# Patient Record
Sex: Female | Born: 1970 | Race: White | Hispanic: No | Marital: Married | Smoking: Former smoker
Health system: Southern US, Community
[De-identification: ages and names within clinical notes are randomized; demographics above are authoritative.]

## PROBLEM LIST (undated history)

## (undated) DIAGNOSIS — T7840XA Allergy, unspecified, initial encounter: Secondary | ICD-10-CM

## (undated) DIAGNOSIS — F419 Anxiety disorder, unspecified: Secondary | ICD-10-CM

## (undated) HISTORY — PX: TONSILLECTOMY: SUR1361

## (undated) HISTORY — DX: Allergy, unspecified, initial encounter: T78.40XA

## (undated) HISTORY — PX: CHOLECYSTECTOMY: SHX55

## (undated) HISTORY — PX: TYMPANOSTOMY TUBE PLACEMENT: SHX32

## (undated) HISTORY — DX: Anxiety disorder, unspecified: F41.9

## (undated) HISTORY — PX: APPENDECTOMY: SHX54

## (undated) HISTORY — PX: LASIK: SHX215

## (undated) HISTORY — PX: NOSE SURGERY: SHX723

## (undated) HISTORY — PX: OTHER SURGICAL HISTORY: SHX169

## (undated) HISTORY — PX: FOOT SURGERY: SHX648

---

## 1998-09-17 ENCOUNTER — Encounter: Payer: Self-pay | Admitting: Obstetrics and Gynecology

## 1998-09-17 ENCOUNTER — Inpatient Hospital Stay (HOSPITAL_COMMUNITY): Admission: AD | Admit: 1998-09-17 | Discharge: 1998-09-17 | Payer: Self-pay | Admitting: Obstetrics and Gynecology

## 1998-10-20 ENCOUNTER — Inpatient Hospital Stay (HOSPITAL_COMMUNITY): Admission: AD | Admit: 1998-10-20 | Discharge: 1998-10-23 | Payer: Self-pay | Admitting: Obstetrics and Gynecology

## 1998-10-20 ENCOUNTER — Encounter (INDEPENDENT_AMBULATORY_CARE_PROVIDER_SITE_OTHER): Payer: Self-pay | Admitting: *Deleted

## 1998-10-24 ENCOUNTER — Encounter (HOSPITAL_COMMUNITY): Admission: RE | Admit: 1998-10-24 | Discharge: 1999-01-22 | Payer: Self-pay | Admitting: Obstetrics and Gynecology

## 1998-11-28 ENCOUNTER — Other Ambulatory Visit: Admission: RE | Admit: 1998-11-28 | Discharge: 1998-11-28 | Payer: Self-pay | Admitting: Obstetrics and Gynecology

## 2000-01-01 ENCOUNTER — Other Ambulatory Visit: Admission: RE | Admit: 2000-01-01 | Discharge: 2000-01-01 | Payer: Self-pay | Admitting: Obstetrics and Gynecology

## 2001-01-02 ENCOUNTER — Other Ambulatory Visit: Admission: RE | Admit: 2001-01-02 | Discharge: 2001-01-02 | Payer: Self-pay | Admitting: Obstetrics and Gynecology

## 2002-02-16 ENCOUNTER — Other Ambulatory Visit: Admission: RE | Admit: 2002-02-16 | Discharge: 2002-02-16 | Payer: Self-pay | Admitting: Obstetrics and Gynecology

## 2003-03-30 ENCOUNTER — Other Ambulatory Visit: Admission: RE | Admit: 2003-03-30 | Discharge: 2003-03-30 | Payer: Self-pay | Admitting: Obstetrics and Gynecology

## 2005-07-31 ENCOUNTER — Inpatient Hospital Stay (HOSPITAL_COMMUNITY): Admission: RE | Admit: 2005-07-31 | Discharge: 2005-08-02 | Payer: Self-pay | Admitting: Psychiatry

## 2005-08-01 ENCOUNTER — Ambulatory Visit: Payer: Self-pay | Admitting: Psychiatry

## 2005-08-24 ENCOUNTER — Ambulatory Visit (HOSPITAL_COMMUNITY): Payer: Self-pay | Admitting: Psychiatry

## 2005-09-21 ENCOUNTER — Ambulatory Visit (HOSPITAL_COMMUNITY): Payer: Self-pay | Admitting: Psychiatry

## 2005-11-19 ENCOUNTER — Ambulatory Visit (HOSPITAL_COMMUNITY): Payer: Self-pay | Admitting: Psychiatry

## 2006-01-21 ENCOUNTER — Ambulatory Visit (HOSPITAL_COMMUNITY): Payer: Self-pay | Admitting: Psychiatry

## 2006-04-24 ENCOUNTER — Ambulatory Visit (HOSPITAL_COMMUNITY): Payer: Self-pay | Admitting: Psychiatry

## 2006-08-19 ENCOUNTER — Ambulatory Visit (HOSPITAL_COMMUNITY): Payer: Self-pay | Admitting: Psychiatry

## 2006-11-18 ENCOUNTER — Ambulatory Visit (HOSPITAL_COMMUNITY): Payer: Self-pay | Admitting: Psychiatry

## 2007-04-02 ENCOUNTER — Ambulatory Visit (HOSPITAL_COMMUNITY): Payer: Self-pay | Admitting: Psychiatry

## 2007-11-07 ENCOUNTER — Encounter: Admission: RE | Admit: 2007-11-07 | Discharge: 2007-11-07 | Payer: Self-pay | Admitting: Family Medicine

## 2010-01-25 ENCOUNTER — Encounter: Admission: RE | Admit: 2010-01-25 | Discharge: 2010-01-25 | Payer: Self-pay | Admitting: Obstetrics and Gynecology

## 2010-03-29 ENCOUNTER — Ambulatory Visit
Admission: RE | Admit: 2010-03-29 | Discharge: 2010-03-29 | Payer: Self-pay | Source: Home / Self Care | Attending: Obstetrics and Gynecology | Admitting: Obstetrics and Gynecology

## 2010-06-16 NOTE — Op Note (Signed)
NAMEFRANCE, [name]              ACCOUNT NO.:  [id number]  MEDICAL RECORD NO.:  [id number]          PATIENT TYPE:  AMB  LOCATION:  [facility]                         FACILITY:  [facility]  PHYSICIAN:  Sherry A. Dickstein, M.D.DATE OF BIRTH:  Sep 22, 1970  DATE OF PROCEDURE:  03/29/2010 DATE OF DISCHARGE:                              OPERATIVE REPORT   PREOPERATIVE DIAGNOSES:  Menorrhagia and endometrial polyp.  POSTOPERATIVE DIAGNOSES:  Menorrhagia and endometrial polyp.  PROCEDURE:  Dilatation and curettage hysteroscopy with resectoscope and NovaSure endometrial ablation.  SURGEON:  Sherry A. Rosalio Macadamia, M.D.  ANESTHESIA:  MAC.  INDICATIONS:  This is a 40 year old G2, P 2-0-0-2 woman whose menstrual cycles every 28 days lasting 5-7 days with heavy flow with clotting. The patient is using vasectomy for contraception.  The patient had a sonohysterogram evaluation which showed an endometrial polyp present. Although the endometrial polyp has been recommended to be removed with a D and C hysteroscopy with resectoscope, the patient also has requested an endometrial ablation.  Therefore, the patient is brought to the operating room for a D and C hysteroscopy with resection as well as a NovaSure endometrial ablation.  FINDINGS:  Normal-sized anteflexed uterus.  No adnexal mass and endometrial polyp present.  PROCEDURE:  The patient was brought into the operating room and given adequate IV sedation.  She was placed in the dorsal lithotomy position. Her perineum was washed with Betadine.  Pelvic examination was performed.  Surgeon's gown and gloves were changed.  Speculum was placed within the vagina.  Vagina was washed with Betadine.  Paracervical block was administered with 1% Nesacaine.  Anterior lip of the cervix was grasped with a single-toothed tenaculum.  Cervix was sounded and the endometrial cavity length was measured as well as the endocervical length was measured.  The cervix was  dilated.  However, no significant dilatation was necessary and the hysteroscope was introduced into the endometrial cavity.  Pictures were obtained.  Using a right angle resector the polyps were removed from the endometrial cavity of which there appeared to be two.  Once these polyps were removed, the hysteroscope was removed.  Then the NovaSure instrument was introduced into the endometrial cavity in a routine fashion.  It was opened and adjusted so that it was completely opened.  The width was noted to be 2.5 cm.  The length of the cavity was 4.5 cm.  The endocervical length was 3.5 cm.  The endometrial ablation was started after the cavity had been tested.  The length of the ablation was 2 minutes.  The power was 62 watts and the ablation proceeded without any difficulty.  Once the ablation was completed after the 2 minutes the NovaSure instrument was closed without difficulty.  It was removed from the endometrial cavity and the hysteroscope was reintroduced into the endometrial cavity with complete ablation noted and a picture was obtained.  All instruments were then removed from the vagina.  Adequate hemostasis was present. The patient was taken out of the dorsal lithotomy position.  She was awakened.  She was moved from the operating table to a stretcher in stable condition.  COMPLICATIONS:  None.  ESTIMATED BLOOD LOSS:  Less than 5 cc.     Sherry A. Rosalio Macadamia, M.D.     SAD/MEDQ  D:  03/29/2010  T:  03/29/2010  Job:  [id number]  Electronically Signed by Floyde Parkins M.D. on 06/16/2010 12:35:03 PM

## 2010-06-27 LAB — POCT HEMOGLOBIN-HEMACUE: Hemoglobin: 14.6 g/dL (ref 12.0–15.0)

## 2010-06-27 LAB — POCT PREGNANCY, URINE: Preg Test, Ur: NEGATIVE

## 2010-08-17 ENCOUNTER — Other Ambulatory Visit: Payer: Self-pay | Admitting: Obstetrics and Gynecology

## 2010-09-01 NOTE — H&P (Signed)
[name], [name] NO.:  [id number]   MEDICAL RECORD NO.:  [id number]          PATIENT TYPE:  IPS   LOCATION:  0303                          FACILITY:  [facility]   PHYSICIAN:  Anselm Jungling, MD  DATE OF BIRTH:  11-30-1970   DATE OF ADMISSION:  07/31/2005  DATE OF DISCHARGE:                         PSYCHIATRIC ADMISSION ASSESSMENT   IDENTIFYING INFORMATION:  This is a 40 year old white female who is married.  This is a voluntarily admission.   HISTORY OF PRESENT ILLNESS:  This patient was referred by Dr. Arvilla Market, her  primary care physician. After the patient expressed to him that she had so  much anger towards her husband and 52 year old stepdaughter that she feared  she would hurt herself or the stepdaughter. The patient was referred over  from the M.D.'s office. Today, this pleasant 40 year old patient reports  that conflict in parenting of the 41 year old has been a chronic marital  problem between them for several years. They separated once for 10 months in  the past over this. The patient feels that the husband sides with the 22-  year-old daughter against his wife and allows the 24 year old to speak  disrespectfully to her. The patient feels overwhelmed by this, is hurt by it  and felt that she could no longer cope on Sunday when the husband said that  he was going to leave her again over issues with the 41 year old. The  patient endorses depressed mood, feeling overwhelmed, more angry and  irritable to the point that she feels that her anger is somewhat out of  proportion to the situation. Says her mood is depressed and irritable. Has  had vague suicidal thoughts when she is really anger. Feels that she will  loose control. At one point last week, she took a knife into the bathroom  with her, thinking she might want to hurt herself. She has no prior history  of attempts but has access to both guns and knife. She denies that she is  truly homicidal toward  the stepdaughter but fears that she will loose her  temper and hit her or otherwise harm her. She denies any psychotic symptoms.   PAST PSYCHIATRIC HISTORY:  This is the patient's first treatment for  depression. She has no prior history of depression. No prior  hospitalizations or treatment for mood disorder. Has no history of substance  abuse.   SOCIAL HISTORY:  Second marriage for this 40 year old white female who has  children of her own in the home, blended family. No current legal issues.  Husband's level of support is unclear.   FAMILY HISTORY:  Remarkable for father with history of alcoholism.   PAST MEDICAL HISTORY:  The patient's primary care [name] is Dr. Arvilla Market.  Medical problems are normal. Past medical history is remarkable for history  of dry eyes treated with Restasis. Hospitalizations for childbirth only.  Surgery:  Cesarean section in 2006. No history of seizures, blackouts or  memory loss.   MEDICATIONS:  None.   DRUG ALLERGIES:  PENICILLIN which causes rash.   POSITIVE PHYSICAL FINDINGS:  Well-developed, well-nourished female in no  acute  distress. Grooming and hygiene are appropriate.   REVIEW OF SYSTEMS:  Remarkable for sleeping good. Appetite is satisfactory.  No recent weight change. Feeling chronically dysphoric and anxious around  the home. Denies headache or muscle aches. Denies shortness of breath or  symptoms of panic. Denies any abdominal pain. Denies any change in bowel or  bladder function. Review of systems is otherwise negative. No history of  fever or chills.   PHYSICAL EXAMINATION:  Well-developed, well-nourished female in no acute  distress. Five feet tall, 132 pounds. Temperature 98.1, pulse 85,  respirations 20. Blood pressure initially at admission was 141/94 and today  128/85. Pulse 95 today.  HEENT:  Head normocephalic, atraumatic. EENT:  PERRL. Sclerae nonicteric.  Extraocular movements are normal. No rhinorrhea. Oropharynx in  good  condition. Teeth in good condition.  NECK:  Supple. No thyromegaly.  CHEST:  Clear to auscultation.  BREASTS:  Deferred.  CARDIOVASCULAR:  S1 and S2 is heard. No clicks, murmurs or gallops.  ABDOMEN:  Flat, soft, nontender, nondistended.  GENITOURINARY:  Deferred.  EXTREMITIES:  Pink, warm. No cyanosis. Peripheral pulses 2+. No clubbing.  SKIN:  Pale in tone. No rashes. No lesions.  NEUROLOGICAL:  Cranial nerves II-XII are intact. Motor and sensory intact.  Cerebellar function is intact. Neuro is nonfocal.   DIAGNOSTIC STUDIES:  CBC:  WBC 7.8, hemoglobin 14.0, hematocrit 41.8,  platelets 318,000. Chemistry:  141, potassium 3.9, chloride 108, carbon  dioxide 27, BUN 8, creatinine 0.8. Random glucose at 87. Liver enzymes:  SGOT 18, SGPT 14, alkaline phosphatase 31, total bilirubin 0.9. Calcium  within normal limits and magnesium normal. TSH is currently pending, as is  her UA and UDS.   MENTAL STATUS EXAMINATION:  Fully alert female, pleasant, cooperative with  somewhat anxious affect. She does become tearful and a little bit irritable  when starting to talk about the situation at home with her husband. Speech  is normal in pace, tone and amount. No pressure. She is articulate, fluent.  Mood is anxious and depressed, some irritability. Thought process logical  and coherent. Insight is adequate. She is able to clearly say that she is  not homicidal towards her daughter but does fear that her anger becomes so  bad that she would harm her and does not think that she would ever hurt  herself but admits that she has been angry to the point that she gets out of  control. Contracts for safety on the unit. Cognitively, she is intact and  oriented x3. Recent remote memory is intact. Insight good. Impulse control  and judgment within normal limits. Calculation within normal limits.  AXIS I:  Rule out major depression, initial episode, severe.  AXIS II:  No diagnosis.  AXIS III:  No  diagnosis.  AXIS IV:  Severe marital stress and parenting stress.  AXIS V:  Current 35 to 40, past year 1.   PLAN:  The plan is to voluntarily admit the patient with every 15-minute  checks in place. We have discussed the pros and cons of using medications  with her since she is under such tremendous stress at home and have decided  to  start her on Wellbutrin XL 150 mg daily and discussed this with her, and she  is agreement. We are going to get a family session with the husband as soon  as possible to determine the level of support and to discuss the stressors.  Estimated length of stay is five days.  Margaret A. Lorin Picket, N.P.      Anselm Jungling, MD  Electronically Signed    MAS/MEDQ  D:  08/01/2005  T:  08/01/2005  Job:  [id number]

## 2010-09-01 NOTE — Discharge Summary (Signed)
[name], MEINERS NO.:  [id number]   MEDICAL RECORD NO.:  [id number]          PATIENT TYPE:  IPS   LOCATION:  0303                          [facility]ACILITY:  [facility]   PHYSICIAN:  Anselm Jungling, MD  DATE OF BIRTH:  [birth year]   DATE OF ADMISSION:  07/31/2005  DATE OF DISCHARGE:  08/02/2005                                 DISCHARGE SUMMARY   IDENTIFYING DATA/REASON FOR ADMISSION:  The patient is a 40 year old married  Caucasian female who was admitted voluntarily.  She was referred by her  primary care physician after expressing to the doctor that she had so much  anger towards her husband and 71 year old stepdaughter that she felt she  would hurt herself or the stepdaughter.  Please refer to the admission note  for further details pertaining to the symptoms, circumstances and history  that led to her hospitalization.  She came to [place] without any prior treatment  of depression and without any prior history of psychiatric hospitalization.   INITIAL DIAGNOSTIC IMPRESSION:  She was given an initial AXIS I diagnosis of  rule out major depressive episode.   MEDICAL/LABORATORY:  The patient was medically and physically assessed by  the psychiatric nurse practitioner.  Her only medication, on admission, was  a history of dry eyes, treated with Restasis.  This was continued during her  inpatient stay.  There were no significant medical issues during this brief  inpatient psychiatric stay.   HOSPITAL COURSE:  The patient was admitted to the adult inpatient  psychiatric service.  She presented as a normally developed, well-nourished  and well-organized individual who was pleasant, appropriate and non-  irritable.  Her mood was depressed and she was easily tearful but denied any  suicidal plan or intent.  She displayed no sign or symptoms of psychosis or  thought disorder.  She indicated that she very much wanted help in the form  of medication for depression and individual  counseling on an outpatient  basis.  She questioned whether she really needed inpatient treatment at all.   We discussed a trial of the antidepressant, Wellbutrin, and she agreed to  this.  We began Wellbutrin XL 150 mg daily.  We discussed the possibility  that the patient might be more appropriate for the intensive outpatient  program.   On the third hospital day, the patient was still absent suicidal ideation,  was calm, composed and continued her strong desire for outpatient treatment.  She was discharged in good spirits.   AFTERCARE:  The patient was to follow up with Dr. Lolly Mustache in our outpatient  clinic on Aug 24, 2005, and to begin individual counseling at [facility] on August 06, 2005.   DISCHARGE MEDICATIONS:  1.  Wellbutrin XL 150 mg daily.  2.  Restasis eye drops as directed.  3.  The patient's usual contraceptive pill.   DISCHARGE DIAGNOSES:  AXIS I:  Major depressive episode.  AXIS II:  Deferred.  AXIS III:  No acute or chronic illnesses.  AXIS IV:  Stressors:  Severe.  AXIS V:  GAF upon discharge 70.  ______________________________  Anselm Jungling, MD  Electronically Signed     SPB/MEDQ  D:  08/08/2005  T:  08/08/2005  Job:  [id number]

## 2010-12-27 ENCOUNTER — Other Ambulatory Visit: Payer: Self-pay | Admitting: Obstetrics and Gynecology

## 2010-12-27 DIAGNOSIS — Z1231 Encounter for screening mammogram for malignant neoplasm of breast: Secondary | ICD-10-CM

## 2011-01-29 ENCOUNTER — Ambulatory Visit
Admission: RE | Admit: 2011-01-29 | Discharge: 2011-01-29 | Disposition: A | Discharge status: Home / Self Care | Payer: 59 | Source: Ambulatory Visit | Attending: Obstetrics and Gynecology | Admitting: Obstetrics and Gynecology

## 2011-01-29 DIAGNOSIS — Z1231 Encounter for screening mammogram for malignant neoplasm of breast: Secondary | ICD-10-CM

## 2011-12-13 ENCOUNTER — Other Ambulatory Visit: Payer: Self-pay | Admitting: Physician Assistant

## 2011-12-13 DIAGNOSIS — R42 Dizziness and giddiness: Secondary | ICD-10-CM

## 2011-12-14 ENCOUNTER — Ambulatory Visit
Admission: RE | Admit: 2011-12-14 | Discharge: 2011-12-14 | Disposition: A | Discharge status: Home / Self Care | Payer: 59 | Source: Ambulatory Visit | Attending: Physician Assistant | Admitting: Physician Assistant

## 2011-12-14 DIAGNOSIS — R42 Dizziness and giddiness: Secondary | ICD-10-CM

## 2012-01-01 ENCOUNTER — Other Ambulatory Visit: Payer: Self-pay | Admitting: Obstetrics and Gynecology

## 2012-01-01 DIAGNOSIS — Z1231 Encounter for screening mammogram for malignant neoplasm of breast: Secondary | ICD-10-CM

## 2012-01-30 ENCOUNTER — Ambulatory Visit
Admission: RE | Admit: 2012-01-30 | Discharge: 2012-01-30 | Disposition: A | Discharge status: Home / Self Care | Payer: 59 | Source: Ambulatory Visit | Attending: Obstetrics and Gynecology | Admitting: Obstetrics and Gynecology

## 2012-01-30 DIAGNOSIS — Z1231 Encounter for screening mammogram for malignant neoplasm of breast: Secondary | ICD-10-CM

## 2012-12-22 ENCOUNTER — Other Ambulatory Visit: Payer: Self-pay

## 2012-12-22 DIAGNOSIS — Z1231 Encounter for screening mammogram for malignant neoplasm of breast: Secondary | ICD-10-CM

## 2013-01-30 ENCOUNTER — Ambulatory Visit
Admission: RE | Admit: 2013-01-30 | Discharge: 2013-01-30 | Disposition: A | Discharge status: Home / Self Care | Payer: 59 | Source: Ambulatory Visit

## 2013-01-30 DIAGNOSIS — Z1231 Encounter for screening mammogram for malignant neoplasm of breast: Secondary | ICD-10-CM

## 2014-01-11 ENCOUNTER — Other Ambulatory Visit: Payer: Self-pay

## 2014-01-11 DIAGNOSIS — Z1231 Encounter for screening mammogram for malignant neoplasm of breast: Secondary | ICD-10-CM

## 2014-02-01 ENCOUNTER — Encounter (INDEPENDENT_AMBULATORY_CARE_PROVIDER_SITE_OTHER): Payer: Self-pay

## 2014-02-01 ENCOUNTER — Ambulatory Visit
Admission: RE | Admit: 2014-02-01 | Discharge: 2014-02-01 | Disposition: A | Discharge status: Home / Self Care | Payer: 59 | Source: Ambulatory Visit

## 2014-02-01 DIAGNOSIS — Z1231 Encounter for screening mammogram for malignant neoplasm of breast: Secondary | ICD-10-CM

## 2014-12-31 ENCOUNTER — Other Ambulatory Visit: Payer: Self-pay

## 2014-12-31 DIAGNOSIS — Z1231 Encounter for screening mammogram for malignant neoplasm of breast: Secondary | ICD-10-CM

## 2015-02-03 ENCOUNTER — Ambulatory Visit
Admission: RE | Admit: 2015-02-03 | Discharge: 2015-02-03 | Disposition: A | Discharge status: Home / Self Care | Payer: 59 | Source: Ambulatory Visit

## 2015-02-03 DIAGNOSIS — Z1231 Encounter for screening mammogram for malignant neoplasm of breast: Secondary | ICD-10-CM

## 2016-01-04 ENCOUNTER — Other Ambulatory Visit: Payer: Self-pay | Admitting: Obstetrics and Gynecology

## 2016-01-04 DIAGNOSIS — Z1231 Encounter for screening mammogram for malignant neoplasm of breast: Secondary | ICD-10-CM

## 2016-02-06 ENCOUNTER — Ambulatory Visit: Payer: 59

## 2016-02-14 ENCOUNTER — Ambulatory Visit
Admission: RE | Admit: 2016-02-14 | Discharge: 2016-02-14 | Disposition: A | Discharge status: Home / Self Care | Payer: 59 | Source: Ambulatory Visit | Attending: Obstetrics and Gynecology | Admitting: Obstetrics and Gynecology

## 2016-02-14 DIAGNOSIS — Z1231 Encounter for screening mammogram for malignant neoplasm of breast: Secondary | ICD-10-CM

## 2016-07-20 DIAGNOSIS — H524 Presbyopia: Secondary | ICD-10-CM | POA: Diagnosis not present

## 2016-08-16 DIAGNOSIS — D1801 Hemangioma of skin and subcutaneous tissue: Secondary | ICD-10-CM | POA: Diagnosis not present

## 2016-08-16 DIAGNOSIS — L821 Other seborrheic keratosis: Secondary | ICD-10-CM | POA: Diagnosis not present

## 2016-08-16 DIAGNOSIS — L814 Other melanin hyperpigmentation: Secondary | ICD-10-CM | POA: Diagnosis not present

## 2016-08-29 DIAGNOSIS — Z79899 Other long term (current) drug therapy: Secondary | ICD-10-CM | POA: Diagnosis not present

## 2016-08-29 DIAGNOSIS — Z0000 Encounter for general adult medical examination without abnormal findings: Secondary | ICD-10-CM | POA: Diagnosis not present

## 2016-08-29 DIAGNOSIS — G43909 Migraine, unspecified, not intractable, without status migrainosus: Secondary | ICD-10-CM | POA: Diagnosis not present

## 2016-10-25 DIAGNOSIS — Z01419 Encounter for gynecological examination (general) (routine) without abnormal findings: Secondary | ICD-10-CM | POA: Diagnosis not present

## 2016-12-12 DIAGNOSIS — M25511 Pain in right shoulder: Secondary | ICD-10-CM | POA: Diagnosis not present

## 2017-01-10 ENCOUNTER — Other Ambulatory Visit: Payer: Self-pay | Admitting: Obstetrics and Gynecology

## 2017-01-10 DIAGNOSIS — Z1231 Encounter for screening mammogram for malignant neoplasm of breast: Secondary | ICD-10-CM

## 2017-02-14 ENCOUNTER — Ambulatory Visit
Admission: RE | Admit: 2017-02-14 | Discharge: 2017-02-14 | Disposition: A | Discharge status: Home / Self Care | Payer: 59 | Source: Ambulatory Visit | Attending: Obstetrics and Gynecology | Admitting: Obstetrics and Gynecology

## 2017-02-14 DIAGNOSIS — Z1231 Encounter for screening mammogram for malignant neoplasm of breast: Secondary | ICD-10-CM

## 2017-05-21 DIAGNOSIS — J019 Acute sinusitis, unspecified: Secondary | ICD-10-CM | POA: Diagnosis not present

## 2017-07-22 DIAGNOSIS — H33193 Other retinoschisis and retinal cysts, bilateral: Secondary | ICD-10-CM | POA: Diagnosis not present

## 2017-08-26 DIAGNOSIS — L821 Other seborrheic keratosis: Secondary | ICD-10-CM | POA: Diagnosis not present

## 2017-08-26 DIAGNOSIS — D1801 Hemangioma of skin and subcutaneous tissue: Secondary | ICD-10-CM | POA: Diagnosis not present

## 2017-08-26 DIAGNOSIS — L814 Other melanin hyperpigmentation: Secondary | ICD-10-CM | POA: Diagnosis not present

## 2017-09-25 DIAGNOSIS — Z0000 Encounter for general adult medical examination without abnormal findings: Secondary | ICD-10-CM | POA: Diagnosis not present

## 2017-09-25 DIAGNOSIS — Z13220 Encounter for screening for lipoid disorders: Secondary | ICD-10-CM | POA: Diagnosis not present

## 2017-10-29 DIAGNOSIS — Z01419 Encounter for gynecological examination (general) (routine) without abnormal findings: Secondary | ICD-10-CM | POA: Diagnosis not present

## 2018-01-06 DIAGNOSIS — Z23 Encounter for immunization: Secondary | ICD-10-CM | POA: Diagnosis not present

## 2018-01-07 ENCOUNTER — Other Ambulatory Visit: Payer: Self-pay | Admitting: Obstetrics and Gynecology

## 2018-01-07 DIAGNOSIS — Z1231 Encounter for screening mammogram for malignant neoplasm of breast: Secondary | ICD-10-CM

## 2018-02-21 ENCOUNTER — Ambulatory Visit
Admission: RE | Admit: 2018-02-21 | Discharge: 2018-02-21 | Disposition: A | Discharge status: Home / Self Care | Payer: 59 | Source: Ambulatory Visit | Attending: Obstetrics and Gynecology | Admitting: Obstetrics and Gynecology

## 2018-02-21 DIAGNOSIS — Z1231 Encounter for screening mammogram for malignant neoplasm of breast: Secondary | ICD-10-CM

## 2018-08-21 DIAGNOSIS — Z86018 Personal history of other benign neoplasm: Secondary | ICD-10-CM | POA: Diagnosis not present

## 2018-08-21 DIAGNOSIS — C44319 Basal cell carcinoma of skin of other parts of face: Secondary | ICD-10-CM | POA: Diagnosis not present

## 2018-08-21 DIAGNOSIS — L814 Other melanin hyperpigmentation: Secondary | ICD-10-CM | POA: Diagnosis not present

## 2018-08-21 DIAGNOSIS — D485 Neoplasm of uncertain behavior of skin: Secondary | ICD-10-CM | POA: Diagnosis not present

## 2018-08-21 DIAGNOSIS — L821 Other seborrheic keratosis: Secondary | ICD-10-CM | POA: Diagnosis not present

## 2018-09-16 DIAGNOSIS — H33103 Unspecified retinoschisis, bilateral: Secondary | ICD-10-CM | POA: Diagnosis not present

## 2018-09-16 DIAGNOSIS — H52203 Unspecified astigmatism, bilateral: Secondary | ICD-10-CM | POA: Diagnosis not present

## 2018-10-21 DIAGNOSIS — Z79899 Other long term (current) drug therapy: Secondary | ICD-10-CM | POA: Diagnosis not present

## 2018-10-21 DIAGNOSIS — Z13220 Encounter for screening for lipoid disorders: Secondary | ICD-10-CM | POA: Diagnosis not present

## 2018-10-24 DIAGNOSIS — Z0000 Encounter for general adult medical examination without abnormal findings: Secondary | ICD-10-CM | POA: Diagnosis not present

## 2018-10-31 DIAGNOSIS — Z6826 Body mass index (BMI) 26.0-26.9, adult: Secondary | ICD-10-CM | POA: Diagnosis not present

## 2018-10-31 DIAGNOSIS — Z01419 Encounter for gynecological examination (general) (routine) without abnormal findings: Secondary | ICD-10-CM | POA: Diagnosis not present

## 2018-11-05 DIAGNOSIS — C44319 Basal cell carcinoma of skin of other parts of face: Secondary | ICD-10-CM | POA: Diagnosis not present

## 2019-01-22 ENCOUNTER — Other Ambulatory Visit: Payer: Self-pay | Admitting: Obstetrics and Gynecology

## 2019-01-22 DIAGNOSIS — Z1231 Encounter for screening mammogram for malignant neoplasm of breast: Secondary | ICD-10-CM

## 2019-03-04 ENCOUNTER — Other Ambulatory Visit: Payer: Self-pay

## 2019-03-04 ENCOUNTER — Ambulatory Visit
Admission: RE | Admit: 2019-03-04 | Discharge: 2019-03-04 | Disposition: A | Discharge status: Home / Self Care | Payer: BC Managed Care – PPO | Source: Ambulatory Visit | Attending: Obstetrics and Gynecology | Admitting: Obstetrics and Gynecology

## 2019-03-04 DIAGNOSIS — Z1231 Encounter for screening mammogram for malignant neoplasm of breast: Secondary | ICD-10-CM

## 2019-08-11 ENCOUNTER — Ambulatory Visit (INDEPENDENT_AMBULATORY_CARE_PROVIDER_SITE_OTHER): Payer: BC Managed Care – PPO

## 2019-08-11 ENCOUNTER — Ambulatory Visit (INDEPENDENT_AMBULATORY_CARE_PROVIDER_SITE_OTHER): Payer: BC Managed Care – PPO | Admitting: Orthopaedic Surgery

## 2019-08-11 ENCOUNTER — Other Ambulatory Visit: Payer: Self-pay

## 2019-08-11 ENCOUNTER — Encounter: Payer: Self-pay | Admitting: Orthopaedic Surgery

## 2019-08-11 DIAGNOSIS — M5441 Lumbago with sciatica, right side: Secondary | ICD-10-CM

## 2019-08-11 DIAGNOSIS — M5442 Lumbago with sciatica, left side: Secondary | ICD-10-CM

## 2019-08-11 DIAGNOSIS — M5431 Sciatica, right side: Secondary | ICD-10-CM | POA: Diagnosis not present

## 2019-08-11 MED ORDER — HYDROCODONE-ACETAMINOPHEN 5-325 MG PO TABS: 1.0000 | ORAL_TABLET | Freq: Four times a day (QID) | ORAL | 0 refills | Status: DC | PRN

## 2019-08-11 MED ORDER — METHYLPREDNISOLONE 4 MG PO TABS: ORAL_TABLET | ORAL | 0 refills | Status: DC

## 2019-08-11 MED ORDER — METHOCARBAMOL 500 MG PO TABS: 500.0000 mg | ORAL_TABLET | Freq: Four times a day (QID) | ORAL | 1 refills | Status: DC | PRN

## 2019-08-11 NOTE — Progress Notes (Signed)
Office Visit Note   Patient: [name]           Date of Birth: 05/14/1970           MRN: [id number] Visit Date: 08/11/2019              Requested by: Mayra Neer, MD [address] [place],  [place] [address] PCP: Mayra Neer, MD   Assessment & Plan: Visit Diagnoses:  1. Bilateral low back pain with bilateral sciatica, unspecified chronicity   2. Sciatica, right side     Plan: Given the degree of sciatica she is experiencing I have shown her back extension exercises I wonder try twice a day for the next week.  Also, start her on a 6-day steroid taper as well as Robaxin and hydrocodone.  She understands that if things worsen acutely I would want to obtain a MRI.  I would like to evaluate her again next week to determine whether or not she does need MRI but this is after having a trial of anti-inflammatories with back extension exercises.  She agrees with this treatment plan.  All questions and concerns were answered and addressed.  Follow-Up Instructions: Return in about 1 week (around 08/18/2019).   Orders:  Orders Placed This Encounter  Procedures  . XR Lumbar Spine 2-3 Views   Meds ordered this encounter  Medications  . methylPREDNISolone (MEDROL) 4 MG tablet    Sig: Medrol dose pack. Take as instructed    Dispense:  21 tablet    Refill:  0  . methocarbamol (ROBAXIN) 500 MG tablet    Sig: Take 1 tablet (500 mg total) by mouth every 6 (six) hours as needed for muscle spasms.    Dispense:  60 tablet    Refill:  1  . HYDROcodone-acetaminophen (NORCO/VICODIN) 5-325 MG tablet    Sig: Take 1 tablet by mouth every 6 (six) hours as needed for moderate pain.    Dispense:  30 tablet    Refill:  0      Procedures: No procedures performed   Clinical Data: No additional findings.   Subjective: Chief Complaint  Patient presents with  . Lower Back - Pain  Patient is a very pleasant 49 year old female who comes in with worsening sciatica that  really flared up last night and this morning.  It is very painful her to walk.  She denies any change in bowel or bladder function.  She has not injured her back before but has been dealing with low back pain and sciatica for a few months.  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  She is not a diabetic.  She is a smoker but has not smoked in over 20 years.  She denies any numbness and tingling in the feet but does report pain in the lower back to the right side that does radiate into her buttocks and down the back of her leg. HPI  Review of Systems  She currently denies any headache, chest pain, shortness of breath, fever, chills, nausea, vomiting  Objective: Vital Signs: There were no vitals taken for this visit.  Physical Exam She is alert and orient x3 and in no acute distress Ortho Exam Examination of her lumbar spine does show significant pain with flexion and extension.  She does have pain on sciatic stretch and a positive straight leg raise bilaterally all focusing to the right side.  She has 5 out of 5 strength in the bilateral  extremities and normal sensation. Specialty Comments:  No specialty comments available.  Imaging: XR Lumbar Spine 2-3 Views  Result Date: 08/11/2019 2 views of the lumbar spine show no acute findings.    PMFS History: There are no problems to display for this patient.  No past medical history on file.  Family History  Problem Relation Age of Onset  . Breast cancer Paternal Grandmother     No past surgical history on file. Social History   Occupational History  . Not on file  Tobacco Use  . Smoking status: Not on file  Substance and Sexual Activity  . Alcohol use: Not on file  . Drug use: Not on file  . Sexual activity: Not on file

## 2019-08-18 ENCOUNTER — Other Ambulatory Visit: Payer: Self-pay

## 2019-08-18 ENCOUNTER — Encounter: Payer: Self-pay | Admitting: Orthopaedic Surgery

## 2019-08-18 ENCOUNTER — Ambulatory Visit: Payer: BC Managed Care – PPO | Admitting: Orthopaedic Surgery

## 2019-08-18 DIAGNOSIS — M5442 Lumbago with sciatica, left side: Secondary | ICD-10-CM

## 2019-08-18 DIAGNOSIS — M5441 Lumbago with sciatica, right side: Secondary | ICD-10-CM

## 2019-08-18 DIAGNOSIS — M4807 Spinal stenosis, lumbosacral region: Secondary | ICD-10-CM

## 2019-08-18 DIAGNOSIS — M5431 Sciatica, right side: Secondary | ICD-10-CM | POA: Diagnosis not present

## 2019-08-18 NOTE — Progress Notes (Signed)
The patient comes in today 1 week after I started on a steroid taper combined with pain medication, muscle relaxants and extension exercises to treat significant sciatica.  Steroids have helped her symptoms somewhat but she still has significant pain and radicular symptoms going down both her legs.  On exam today she still has quite a significant straight leg raise on the right and left with quite significant pain.  She is still having hard time standing straight up.  She continues to work on back extension exercises and is on anti-inflammatories having finished steroid taper.  The pain is significant radiating down her right leg past her knee into her foot.  Based on her worsening sciatic symptoms and the failure of conservative treatment including back extension exercises, rest, anti-inflammatories and a steroid taper, a MRI of of the lumbar spine is warranted to rule out herniated disc.  She agrees with the treatment plan and we will see her back once we have this MRI.  Should continue with back extension exercises in the interim and anti-inflammatories.  If this acutely worsens or she has change in bowel bladder function she will let us know immediately.

## 2019-08-24 DIAGNOSIS — Z86018 Personal history of other benign neoplasm: Secondary | ICD-10-CM | POA: Diagnosis not present

## 2019-08-24 DIAGNOSIS — C44319 Basal cell carcinoma of skin of other parts of face: Secondary | ICD-10-CM | POA: Diagnosis not present

## 2019-08-24 DIAGNOSIS — L821 Other seborrheic keratosis: Secondary | ICD-10-CM | POA: Diagnosis not present

## 2019-08-24 DIAGNOSIS — D2222 Melanocytic nevi of left ear and external auricular canal: Secondary | ICD-10-CM | POA: Diagnosis not present

## 2019-08-24 DIAGNOSIS — L814 Other melanin hyperpigmentation: Secondary | ICD-10-CM | POA: Diagnosis not present

## 2019-08-24 DIAGNOSIS — D485 Neoplasm of uncertain behavior of skin: Secondary | ICD-10-CM | POA: Diagnosis not present

## 2019-09-01 ENCOUNTER — Ambulatory Visit: Payer: BC Managed Care – PPO | Admitting: Orthopaedic Surgery

## 2019-09-01 ENCOUNTER — Telehealth: Payer: Self-pay | Admitting: Orthopaedic Surgery

## 2019-09-01 NOTE — Telephone Encounter (Signed)
Her insurance company won't allow a MRI of her back until she at least trys PT.  Please set her up for outpatient PT to work on her lumbar spine due to pain and sciatica.  I did speak to her about it.

## 2019-09-01 NOTE — Telephone Encounter (Signed)
Patient called requesting call from Dr. Ninfa Linden only. She has questions and concerns. Please call patient as soon as possible at [phone].

## 2019-09-02 ENCOUNTER — Other Ambulatory Visit: Payer: Self-pay

## 2019-09-02 DIAGNOSIS — M545 Low back pain, unspecified: Secondary | ICD-10-CM

## 2019-09-02 DIAGNOSIS — C44319 Basal cell carcinoma of skin of other parts of face: Secondary | ICD-10-CM | POA: Diagnosis not present

## 2019-09-09 ENCOUNTER — Other Ambulatory Visit: Payer: BC Managed Care – PPO

## 2019-09-10 ENCOUNTER — Encounter: Payer: Self-pay | Admitting: Physical Therapy

## 2019-09-10 ENCOUNTER — Ambulatory Visit (INDEPENDENT_AMBULATORY_CARE_PROVIDER_SITE_OTHER): Payer: BC Managed Care – PPO | Admitting: Physical Therapy

## 2019-09-10 ENCOUNTER — Other Ambulatory Visit: Payer: Self-pay

## 2019-09-10 DIAGNOSIS — M5441 Lumbago with sciatica, right side: Secondary | ICD-10-CM

## 2019-09-10 NOTE — Addendum Note (Signed)
Addended by: Debbe Odea on: 09/10/2019 07:48 PM   Modules accepted: Orders

## 2019-09-10 NOTE — Therapy (Signed)
[facility] [address] [place], [place], [address] Phone: [phone]   Fax:  [phone]  Physical Therapy Evaluation  Patient Details  Name: [name] MRN: [id number] Date of Birth: October 15, 1970 Referring Provider (PT): Ninfa Linden, MD   Encounter Date: 09/10/2019  PT End of Session - 09/10/19 1548    Visit Number  1    Number of Visits  12    Date for PT Re-Evaluation  10/22/19    Authorization Type  BCBS    PT Start Time  [id number]    PT Stop Time  1435    PT Time Calculation (min)  50 min    Activity Tolerance  Patient tolerated treatment well    Behavior During Therapy  [facility] for tasks assessed/performed       History reviewed. No pertinent past medical history.  History reviewed. No pertinent surgical history.  There were no vitals filed for this visit.   Subjective Assessment - 09/10/19 1531    Subjective  Low back pain for months started in Jan-Feb no known reason for onset of pain. Pain is Right sided and into her Rt buttock but does not go further down her leg. She describes pain as N/T, burning and sharp into her Rt buttock. Standing and walking makes it worse anything less than 15 minutes, and nothing really is helping other than short relief with rest and NSAID. Pain is constant and mostly in morning or at night. She denies any red flag symptoms.    Pertinent History  no significant PMH    Limitations  Lifting;Standing;Walking;House hold activities    How long can you sit comfortably?  depends    How long can you stand comfortably?  15 min    How long can you walk comfortably?  15 min    Diagnostic tests  2 views of the lumbar spine show no acute findings. MRI ordered but insurance denied until she trys PT    Patient Stated Goals  reduce pain    Currently in Pain?  Yes    Pain Score  5    can get up to 8 at worse   Pain Location  Back    Pain Orientation  Right    Pain Descriptors / Indicators   Aching;Burning;Numbness;Shooting    Pain Type  Chronic pain    Pain Radiating Towards  down Rt buttock    Pain Onset  More than a month ago    Pain Frequency  Constant    Multiple Pain Sites  No         [facility] PT Assessment - 09/10/19 0001      Assessment   Medical Diagnosis  Rt sided LBP with sciatica    Referring Provider (PT)  Ninfa Linden, MD    Onset Date/Surgical Date  --   onset of pain Jan-Feb 2021   Next MD Visit  09/15/19    Prior Therapy  none      Balance Screen   Has the patient fallen in the past 6 months  No      [place] residence      Prior Function   Level of Independence  Independent    Vocation  Full time employment    Vocation Requirements  works from home for [employer]   Overall Cognitive Status  Within Functional Limits for tasks assessed      Sensation   [place]  Intact      Coordination   Gross Motor Movements are Fluid and Coordinated  Yes      Posture/Postural Control   Posture Comments  perfers to stand shifting to left side, anterior pelvic tilt      ROM / Strength   AROM / PROM / Strength  AROM;Strength      AROM   AROM Assessment Site  Lumbar    Lumbar Flexion  50%, shooting pain down Rt buttock    Lumbar Extension  75% mild pain in center    Lumbar - Right Side Bend  WNL    Lumbar - Left Side Bend  WNL    Lumbar - Right Rotation  WNL    Lumbar - Left Rotation  WNL      Strength   Overall Strength Comments  leg strength WNL bilat      Palpation   Palpation comment  TTP Rt PSIS and Rt glutes/piriformis, she denies any pain with spinal palpation and has WNL spinal mobility with PAM testing      Special Tests   Other special tests  + slump test on Lt that was neg on Rt, but then + SLR on Rt that was neg on Lt.  Pain worse with repeated extension but not down her buttock, no chage in pain with lateral shifting, pain increased with POE but some relief with prone over  one pillow. Pain increased with LAD       Transfers   Transfers  Independent with all Transfers      Ambulation/Gait   Gait Comments  WFL                  Objective measurements completed on examination: See above findings.      [facility] Adult PT Treatment/Exercise - 09/10/19 0001      Modalities   Modalities  Cryotherapy;Electrical Stimulation      Cryotherapy   Number Minutes Cryotherapy  15 Minutes    Cryotherapy Location  Lumbar Spine    Type of Cryotherapy  Ice pack      Electrical Stimulation   Electrical Stimulation Location  lumbar in prone lying over one pillow with ice    Electrical Stimulation Action  IFC    Electrical Stimulation Parameters  tolerance    Electrical Stimulation Goals  Pain             PT Education - 09/10/19 1547    Education Details  HEP, TENS, POC    Person(s) Educated  Patient    Methods  Explanation;Demonstration;Verbal cues;Handout    Comprehension  Verbalized understanding;Need further instruction          PT Long Term Goals - 09/10/19 1914      PT LONG TERM GOAL #1   Title  Pt will be I and compliant with HEP.    Time  6    Period  Weeks    Status  New    Target Date  10/22/19      PT LONG TERM GOAL #2   Title  Pt will have painfree lumbar ROM to WNL    Status  New      PT LONG TERM GOAL #3   Title  Pt will have overall less than 3/10 pain with usual activity    Status  New             Plan - 09/10/19 1556    Clinical Impression Statement  Pt presents with Rt LBP  with radiculopathy into her Rt buttock since January 2021. XR negative, and has order for MRI however insurance has denied this until she tries PT. Special tests today to suggest disc pathology. She has overall decreased lumbar ROM, anterior pelvic tilt, decreased activity tolerance for standing and walking and increased pain limiting her funciton. She will benefit from skilled PT to address these deficits.    Stability/Clinical Decision  Making  Stable/Uncomplicated    Clinical Decision Making  Low    Rehab Potential  Good    PT Frequency  2x / week    PT Duration  6 weeks    PT Treatment/Interventions  ADLs/Self Care Home Management;Cryotherapy;Electrical Stimulation;Iontophoresis 4mg /ml Dexamethasone;Moist Heat;Traction;Ultrasound;Gait training;Stair training;Therapeutic activities;Therapeutic exercise;Neuromuscular re-education;Manual techniques;Passive range of motion;Dry needling;Taping;Spinal Manipulations;Joint Manipulations    PT Next Visit Plan  how is HEP, no improvements with mckenzie program day one but may need to revist, may benefit from traction, spinal manip, manual, DN. Further look at Rt SI joint?    PT Home Exercise Plan  [email or web address]       Patient will benefit from skilled therapeutic intervention in order to improve the following deficits and impairments:  Decreased activity tolerance, Decreased mobility, Decreased range of motion, Decreased strength, Difficulty walking, Impaired flexibility, Increased fascial restricitons, Increased muscle spasms, Postural dysfunction, Pain  Visit Diagnosis: Right-sided low back pain with right-sided sciatica, unspecified chronicity     Problem List There are no problems to display for this patient.   Silvestre Mesi 09/10/2019, 7:21 PM  [facility] Physical Therapy [address] [place], [place], [address] Phone: [phone]   Fax:  [phone]  Name: Makynlie Franca MRN: [id number] Date of Birth: Sep 05, 1970

## 2019-09-10 NOTE — Patient Instructions (Signed)
Access Code: [email or web address]: [email or web address]: 05/27/2021Prepared by: Aaron Edelman NelsonExercises  Lying Prone with 1 Pillow - 2 x daily - 6 x weekly - 1 reps - 1 sets - 3-5 lminutes hold  Supine Hamstring Stretch with Strap - 2 x daily - 6 x weekly - 3 reps - 1 sets - 30 hold  Supine Figure 4 Piriformis Stretch - 2 x daily - 6 x weekly - 1 sets - 3 reps - 30 hold  Supine Posterior Pelvic Tilt - 2 x daily - 6 x weekly - 2 sets - 10 reps - 5 hold  Supine Bridge - 2 x daily - 6 x weekly - 2 sets - 10 reps - 5 hold Patient Education  TENS Therapy

## 2019-09-15 ENCOUNTER — Encounter: Payer: Self-pay | Admitting: Orthopaedic Surgery

## 2019-09-15 ENCOUNTER — Other Ambulatory Visit: Payer: Self-pay

## 2019-09-15 ENCOUNTER — Ambulatory Visit: Payer: BC Managed Care – PPO | Admitting: Orthopaedic Surgery

## 2019-09-15 DIAGNOSIS — M4807 Spinal stenosis, lumbosacral region: Secondary | ICD-10-CM

## 2019-09-15 DIAGNOSIS — M5431 Sciatica, right side: Secondary | ICD-10-CM

## 2019-09-15 NOTE — Progress Notes (Signed)
The patient continues to experience right-sided low back pain with sciatic symptoms.  She has been going to physical therapy for her back.  We have tried a steroid taper as well as activity modification, anti-inflammatories, rest, ice and heat.  She still having these persistent symptoms.  We have tried to order an MRI for her lumbar spine before but this was denied until she had at least been through other conservative treatment measures which she had now has and is continuing to experience right-sided low back pain.  On exam she has significant pain in the lower aspect of her lumbar spine to the right side and radiates into the sciatic region.  She does have good strength in the bilateral lower extremities and no numbness today.  I will have her try Voltaren gel for her back and she will continue her outpatient physical therapy.  At this point, I do feel is warranted to try to obtain an MRI again given her persistent symptoms there is been going on now for well over 6 weeks and she has been going through all conservative treatment measures including physical therapy.  We will once again submit for MRI of her lumbar spine to rule out herniated disc.  We will put down for follow-up in 4 weeks and she will continue therapy in the interim.

## 2019-09-16 ENCOUNTER — Ambulatory Visit: Payer: BC Managed Care – PPO | Admitting: Physical Therapy

## 2019-09-16 ENCOUNTER — Encounter: Payer: Self-pay | Admitting: Physical Therapy

## 2019-09-16 DIAGNOSIS — M5441 Lumbago with sciatica, right side: Secondary | ICD-10-CM

## 2019-09-16 NOTE — Therapy (Signed)
[facility] Physical Therapy [address] [place], [place], [address] Phone: [phone]   Fax:  [phone]  Physical Therapy Treatment  Patient Details  Name: [name] MRN: [id number] Date of Birth: [birth year] Referring Provider (PT): Ninfa Linden, MD   Encounter Date: 09/16/2019  PT End of Session - 09/16/19 1123    Visit Number  2    Number of Visits  12    Date for PT Re-Evaluation  10/22/19    Authorization Type  BCBS    PT Start Time  [id number]    PT Stop Time  1058    PT Time Calculation (min)  43 min    Activity Tolerance  Patient tolerated treatment well    Behavior During Therapy  [facility] for tasks assessed/performed       History reviewed. No pertinent past medical history.  History reviewed. No pertinent surgical history.  There were no vitals filed for this visit.  Subjective Assessment - 09/16/19 1014    Subjective  "it's worse today." has used voltaren gel for about 30-45 min    Pertinent History  no significant PMH    Limitations  Lifting;Standing;Walking;House hold activities    How long can you sit comfortably?  depends    How long can you stand comfortably?  15 min    How long can you walk comfortably?  15 min    Diagnostic tests  2 views of the lumbar spine show no acute findings. MRI ordered but insurance denied until she trys PT    Patient Stated Goals  reduce pain    Currently in Pain?  Yes    Pain Score  9     Pain Location  Back    Pain Orientation  Right    Pain Descriptors / Indicators  Burning;Shooting    Pain Type  Chronic pain    Pain Onset  More than a month ago    Pain Frequency  Constant    Aggravating Factors   "everything"    Pain Relieving Factors  medication, rest         [facility] PT Assessment - 09/16/19 1026      Assessment   Medical Diagnosis  Rt sided LBP with sciatica    Referring Provider (PT)  Ninfa Linden, MD      Palpation   Palpation comment  TTP Rt PSIS and Rt glutes/piriformis      Special  Tests    Special Tests  Lumbar    Lumbar Tests  Slump Test      Slump test   Findings  Positive    Side  Right                    [facility] Adult PT Treatment/Exercise - 09/16/19 1019      Exercises   Exercises  Lumbar      Lumbar Exercises: Stretches   Prone on Elbows Stretch  4 reps;60 seconds   continuous   Press Ups  10 reps;10 seconds    Other Lumbar Stretch Exercise  Rt lateral shift correction 10x10 sec    Other Lumbar Stretch Exercise  isometric hip extension on Rt 10x10 sec      Lumbar Exercises: Aerobic   Nustep  L4 x 5 min      Lumbar Exercises: Supine   Clam Limitations  isometric clam 10x10 sec    Bridge with clamshell  10 reps;5 seconds    Bridge with [employer] Limitations  isometric clamshell  PT Education - 09/16/19 1123    Education Details  updated HEP    Person(s) Educated  Patient    Methods  Explanation;Demonstration;Handout    Comprehension  Verbalized understanding;Returned demonstration;Need further instruction          PT Long Term Goals - 09/10/19 1914      PT LONG TERM GOAL #1   Title  Pt will be I and compliant with HEP.    Time  6    Period  Weeks    Status  New    Target Date  10/22/19      PT LONG TERM GOAL #2   Title  Pt will have painfree lumbar ROM to WNL    Status  New      PT LONG TERM GOAL #3   Title  Pt will have overall less than 3/10 pain with usual activity    Status  New            Plan - 09/16/19 1123    Clinical Impression Statement  Pt presents today with no improvement in symptoms, and feels like she may be slightly worse.  Session today focused on extension based program with SI stabilization, and mild reduction in pain initially following session.  Increased walking and symptoms returned.  Exercises did not increase pain (with slight improvement) so issued for HEP to work on and see if improvement noted.  Will cotninue to benefit from PT to maximize fuction.     Stability/Clinical Decision Making  Stable/Uncomplicated    Rehab Potential  Good    PT Frequency  2x / week    PT Duration  6 weeks    PT Treatment/Interventions  ADLs/Self Care Home Management;Cryotherapy;Electrical Stimulation;Iontophoresis 4mg /ml Dexamethasone;Moist Heat;Traction;Ultrasound;Gait training;Stair training;Therapeutic activities;Therapeutic exercise;Neuromuscular re-education;Manual techniques;Passive range of motion;Dry needling;Taping;Spinal Manipulations;Joint Manipulations    PT Next Visit Plan  consider traction, spinal manip, manual, DN. continue with SI stab exercises    PT Home Exercise Plan  [id number]    Consulted and Agree with Plan of Care  Patient       Patient will benefit from skilled therapeutic intervention in order to improve the following deficits and impairments:  Decreased activity tolerance, Decreased mobility, Decreased range of motion, Decreased strength, Difficulty walking, Impaired flexibility, Increased fascial restricitons, Increased muscle spasms, Postural dysfunction, Pain  Visit Diagnosis: Right-sided low back pain with right-sided sciatica, unspecified chronicity     Problem List There are no problems to display for this patient.     Laureen Abrahams, PT, DPT 09/16/19 11:26 AM    [facility] Physical Therapy [address] [place], [place], [address] Phone: [phone]   Fax:  [phone]  Name: [name] MRN: [id number] Date of Birth: 02/26/1971

## 2019-09-16 NOTE — Patient Instructions (Signed)
Access Code: Olga Millers URL: [email or web address] Date: 09/16/2019 Prepared by: Faustino Congress  Exercises Lying Prone with 1 Pillow - 2 x daily - 6 x weekly - 1 reps - 1 sets - 3-5 lminutes hold Supine Hamstring Stretch with Strap - 2 x daily - 6 x weekly - 3 reps - 1 sets - 30 hold Supine Figure 4 Piriformis Stretch - 2 x daily - 6 x weekly - 1 sets - 3 reps - 30 hold Supine Posterior Pelvic Tilt - 2 x daily - 6 x weekly - 2 sets - 10 reps - 5 hold Supine 90/90 Isometric Hip Extension - 2-3 x daily - 7 x weekly - 1 sets - 10 reps - 10 sec hold Hooklying Isometric Clamshell - 2-3 x daily - 7 x weekly - 1 sets - 10 reps - 10 sec hold Supine Bridge with Resistance Band - 2-3 x daily - 7 x weekly - 1 sets - 10 reps - 5 seconds hold  Patient Education TENS Therapy

## 2019-09-18 ENCOUNTER — Encounter: Payer: Self-pay | Admitting: Physical Therapy

## 2019-09-18 ENCOUNTER — Other Ambulatory Visit: Payer: Self-pay

## 2019-09-18 ENCOUNTER — Ambulatory Visit: Payer: BC Managed Care – PPO | Admitting: Physical Therapy

## 2019-09-18 DIAGNOSIS — M5441 Lumbago with sciatica, right side: Secondary | ICD-10-CM

## 2019-09-18 NOTE — Therapy (Signed)
[facility] [place], [place], [address] Phone: [phone]   Fax:  [phone]  Physical Therapy Treatment  Patient Details  Name: [name] MRN: [id number] Date of Birth: Sep 11, 1970 Referring Provider (PT): Ninfa Linden, MD   Encounter Date: 09/18/2019  PT End of Session - 09/18/19 1100    Visit Number  3    Number of Visits  12    Date for PT Re-Evaluation  10/22/19    Authorization Type  BCBS    PT Start Time  6195    PT Stop Time  1054    PT Time Calculation (min)  39 min    Activity Tolerance  Patient tolerated treatment well    Behavior During Therapy  [facility] for tasks assessed/performed       History reviewed. No pertinent past medical history.  History reviewed. No pertinent surgical history.  There were no vitals filed for this visit.  Subjective Assessment - 09/18/19 1017    Subjective  feels better today -she's been sitting more today and thinks that may be why    Pertinent History  no significant PMH    Limitations  Lifting;Standing;Walking;House hold activities    How long can you sit comfortably?  depends    How long can you stand comfortably?  15 min    How long can you walk comfortably?  15 min    Diagnostic tests  2 views of the lumbar spine show no acute findings. MRI ordered but insurance denied until she trys PT    Patient Stated Goals  reduce pain    Currently in Pain?  Yes    Pain Score  6     Pain Location  Back    Pain Orientation  Right    Pain Descriptors / Indicators  Burning;Shooting    Pain Type  Chronic pain    Pain Radiating Towards  Rt buttock    Pain Onset  More than a month ago    Pain Frequency  Constant    Aggravating Factors   standing/walking    Pain Relieving Factors  medication, rest                        [facility] Adult PT Treatment/Exercise - 09/18/19 1024      Lumbar Exercises: Stretches   Passive Hamstring Stretch  Right;Left;3 reps;30 seconds    Piriformis Stretch  Right;Left;3 reps;30 seconds    Other Lumbar Stretch Exercise  Rt lateral shift correction 10x10 sec    Other Lumbar Stretch Exercise  isometric hip extension on Rt 10x10 sec, then added isometric hip flexion on Lt 10x10 sec      Lumbar Exercises: Standing   Other Standing Lumbar Exercises  RLE hip extension with 3 sec hold 2x10      Lumbar Exercises: Supine   Clam Limitations  isometric clam 10x10 sec    Bridge with clamshell  10 reps;5 seconds    Bridge with Ball Squeeze Limitations  isometric clamshell                  PT Long Term Goals - 09/10/19 1914      PT LONG TERM GOAL #1   Title  Pt will be I and compliant with HEP.    Time  6    Period  Weeks    Status  New    Target Date  10/22/19      PT LONG TERM GOAL #2  Title  Pt will have painfree lumbar ROM to WNL    Status  New      PT LONG TERM GOAL #3   Title  Pt will have overall less than 3/10 pain with usual activity    Status  New            Plan - 09/18/19 1100    Clinical Impression Statement  Symptoms improved today as she reports more sitting than usual today.  Today's session did not exacerbate symptoms and plan to continue to focus on SI stabilization exercises at this time.  Pt to see if she can get MRI scheduled for earlier if able.    Stability/Clinical Decision Making  Stable/Uncomplicated    Rehab Potential  Good    PT Frequency  2x / week    PT Duration  6 weeks    PT Treatment/Interventions  ADLs/Self Care Home Management;Cryotherapy;Electrical Stimulation;Iontophoresis 4mg /ml Dexamethasone;Moist Heat;Traction;Ultrasound;Gait training;Stair training;Therapeutic activities;Therapeutic exercise;Neuromuscular re-education;Manual techniques;Passive range of motion;Dry needling;Taping;Spinal Manipulations;Joint Manipulations    PT Next Visit Plan  consider traction, spinal manip, manual, DN. continue with SI stab exercises    PT Home Exercise Plan  [id number]    Consulted  and Agree with Plan of Care  Patient       Patient will benefit from skilled therapeutic intervention in order to improve the following deficits and impairments:  Decreased activity tolerance, Decreased mobility, Decreased range of motion, Decreased strength, Difficulty walking, Impaired flexibility, Increased fascial restricitons, Increased muscle spasms, Postural dysfunction, Pain  Visit Diagnosis: Right-sided low back pain with right-sided sciatica, unspecified chronicity     Problem List There are no problems to display for this patient.     Laureen Abrahams, PT, DPT 09/18/19 11:14 AM    [facility] Physical Therapy [address] [place], [place], [address] Phone: [phone]   Fax:  [phone]  Name: [name] MRN: [id number] Date of Birth: 08/25/1970

## 2019-09-19 ENCOUNTER — Emergency Department (HOSPITAL_COMMUNITY): Payer: BC Managed Care – PPO

## 2019-09-19 ENCOUNTER — Encounter (HOSPITAL_COMMUNITY): Payer: Self-pay

## 2019-09-19 ENCOUNTER — Emergency Department (HOSPITAL_COMMUNITY)
Admission: EM | Admit: 2019-09-19 | Discharge: 2019-09-19 | Disposition: A | Discharge status: Home / Self Care | Payer: BC Managed Care – PPO | Attending: Emergency Medicine | Admitting: Emergency Medicine

## 2019-09-19 ENCOUNTER — Other Ambulatory Visit: Payer: Self-pay

## 2019-09-19 DIAGNOSIS — M5116 Intervertebral disc disorders with radiculopathy, lumbar region: Secondary | ICD-10-CM | POA: Insufficient documentation

## 2019-09-19 DIAGNOSIS — M545 Low back pain: Secondary | ICD-10-CM | POA: Diagnosis not present

## 2019-09-19 DIAGNOSIS — M5416 Radiculopathy, lumbar region: Secondary | ICD-10-CM | POA: Diagnosis not present

## 2019-09-19 MED ORDER — PREDNISONE 20 MG PO TABS
40.0000 mg | ORAL_TABLET | Freq: Once | ORAL | Status: AC
  Administered 2019-09-19: 40 mg via ORAL
  Filled 2019-09-19: qty 2

## 2019-09-19 MED ORDER — HYDROMORPHONE HCL 1 MG/ML IJ SOLN
1.0000 mg | Freq: Once | INTRAMUSCULAR | Status: AC
  Administered 2019-09-19: 1 mg via INTRAMUSCULAR
  Filled 2019-09-19: qty 1

## 2019-09-19 MED ORDER — DICLOFENAC EPOLAMINE 1.3 % EX PTCH: 1.0000 | MEDICATED_PATCH | Freq: Two times a day (BID) | CUTANEOUS | 1 refills | Status: DC

## 2019-09-19 MED ORDER — PREDNISONE 20 MG PO TABS: 40.0000 mg | ORAL_TABLET | Freq: Every day | ORAL | 0 refills | Status: DC

## 2019-09-19 MED ORDER — DICLOFENAC EPOLAMINE 1.3 % EX PTCH
1.0000 | MEDICATED_PATCH | Freq: Two times a day (BID) | CUTANEOUS | Status: DC
  Administered 2019-09-19: 1 via TRANSDERMAL
  Filled 2019-09-19 (×2): qty 1

## 2019-09-19 NOTE — ED Provider Notes (Signed)
[place] EMERGENCY DEPARTMENT Provider Note   CSN: [id number] Arrival date & time: 09/19/19  1020     History Chief Complaint  Patient presents with  . Back Pain    [name] is a 49 y.o. female.  HPI    Patient presents concern of pain, weakness, difficulty ambulating. Patient states that she is generally well, but now, over the past weeks has developed worsening pain in the right lower back with radiation down the right leg. Unclear what the initial precipitant was, but now, over past weeks, in spite of seeing her orthopedist, doing physical therapy, take medication she has had persistent symptoms. Yesterday, she sustained another minor accident, and since that time has had worsening pain, near inability to ambulate, move the leg secondary to weakness, pain.  No new incontinence, fever, pain elsewhere.  No relief with oral narcotics, muscle relaxants, topical NSAID. She presents with her father who assists with the HPI.  History reviewed. No pertinent past medical history.  There are no problems to display for this patient.   History reviewed. No pertinent surgical history.   OB History   No obstetric history on file.     Family History  Problem Relation Age of Onset  . Breast cancer Paternal Grandmother     Social History   Tobacco Use  . Smoking status: Not on file  Substance Use Topics  . Alcohol use: Not on file  . Drug use: Not on file    Home Medications Prior to Admission medications   Medication Sig Start Date End Date Taking? Authorizing Provider  HYDROcodone-acetaminophen (NORCO/VICODIN) 5-325 MG tablet Take 1 tablet by mouth every 6 (six) hours as needed for moderate pain. 08/11/19   Mcarthur Rossetti, MD  methocarbamol (ROBAXIN) 500 MG tablet Take 1 tablet (500 mg total) by mouth every 6 (six) hours as needed for muscle spasms. 08/11/19   Mcarthur Rossetti, MD  methylPREDNISolone (MEDROL) 4 MG tablet  Medrol dose pack. Take as instructed 08/11/19   Mcarthur Rossetti, MD    Allergies    Patient has no known allergies.  Review of Systems   Review of Systems  Constitutional:       Per HPI, otherwise negative  HENT:       Per HPI, otherwise negative  Respiratory:       Per HPI, otherwise negative  Cardiovascular:       Per HPI, otherwise negative  Gastrointestinal: Negative for vomiting.  Endocrine:       Negative aside from HPI  Genitourinary:       Neg aside from HPI   Musculoskeletal:       Per HPI, otherwise negative  Skin: Negative.   Neurological: Negative for syncope.    Physical Exam Updated Vital Signs BP 128/85 (BP Location: Right Arm)   Pulse 86   Temp 98.5 F (36.9 C) (Oral)   Resp 18   Ht 5\' 1"  (1.549 m)   Wt 72.6 kg   SpO2 100%   BMI 30.23 kg/m   Physical Exam Vitals and nursing note reviewed.  Constitutional:      General: She is not in acute distress.    Appearance: She is well-developed.  HENT:     Head: Normocephalic and atraumatic.  Eyes:     Conjunctiva/sclera: Conjunctivae normal.  Cardiovascular:     Rate and Rhythm: Normal rate and regular rhythm.  Pulmonary:     Effort: Pulmonary effort is normal. No respiratory distress.  Breath sounds: Normal breath sounds. No stridor.  Abdominal:     General: There is no distension.  Musculoskeletal:       Arms:  Skin:    General: Skin is warm and dry.  Neurological:     Mental Status: She is alert and oriented to person, place, and time.     Cranial Nerves: No cranial nerve deficit.     Comments: Patient has strength 5/5 in the right hip, flexion.  Distal right lower extremity strength 5/5, flexion, extension, sensation intact.     ED Results / Procedures / Treatments   Labs (all labs ordered are listed, but only abnormal results are displayed) Labs Reviewed - No data to display  EKG None  Radiology MR LUMBAR SPINE WO CONTRAST  Result Date: 09/19/2019 CLINICAL DATA:   Worsening right-sided low back pain and right leg weakness. Recent trauma. EXAM: MRI LUMBAR SPINE WITHOUT CONTRAST TECHNIQUE: Multiplanar, multisequence MR imaging of the lumbar spine was performed. No intravenous contrast was administered. COMPARISON:  Radiography 08/11/2019 FINDINGS: Segmentation:  5 lumbar type vertebral bodies. Alignment:  Normal Vertebrae:  Normal Conus medullaris and cauda equina: Conus extends to the L1 level. Conus and cauda equina appear normal. Paraspinal and other soft tissues: Normal Disc levels: No abnormality at L3-4 or above. L4-5: Mild disc degeneration with annular bulging slightly more prominent towards the right. Mild stenosis both lateral recesses. Some potential that either L5 nerve could be affected. L5-S1: Normal interspace. IMPRESSION: Single level abnormality at L4-5 with annular bulging. Mild stenosis of both lateral recesses that would have potential to affect either or both L5 nerves. Electronically Signed   By: Nelson Chimes M.D.   On: 09/19/2019 13:39    Procedures Procedures (including critical care time)  Medications Ordered in ED Medications  diclofenac (FLECTOR) 1.3 % 1 patch (1 patch Transdermal Patch Applied 09/19/19 1358)  predniSONE (DELTASONE) tablet 40 mg (has no administration in time range)  HYDROmorphone (DILAUDID) injection 1 mg (1 mg Intramuscular Given 09/19/19 1250)    ED Course  I have reviewed the triage vital signs and the nursing notes.  Pertinent labs & imaging results that were available during my care of the patient were reviewed by me and considered in my medical decision making (see chart for details).  After initial evaluation I reviewed the patient's chart including documentation from her orthopedist, ongoing efforts to obtain MRI, and now with concern for worsening symptoms since additional minor incident yesterday, patient will have MRI, received analgesia, topical meds. 2:30 PM On repeat exam the patient is awake, alert. We  discussed today's MRI results, which I have reviewed. There is evidence for an annular bulge, L4, 5, likely contributing to the patient's symptoms. No evidence for cauda equina or other acute findings. Patient has received additional analgesia here, is amenable to initiation of a short course of steroids, pending close outpatient follow-up with either orthopedics or neurosurgery. Absent other alarming findings, with after mentioned MRI results as above, the patient is appropriate for close outpatient follow-up, discharge. 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

## 2019-09-19 NOTE — Discharge Instructions (Signed)
As discussed, today's evaluation has demonstrated the presence of a bulging disc on MRI. Results are as below. Please be sure to follow-up with either your orthopedist or our neurosurgical colleagues.  Return here for concerning changes in your condition.  MR LUMBAR SPINE WO CONTRAST  Result Date: 09/19/2019 CLINICAL DATA:  Worsening right-sided low back pain and right leg weakness. Recent trauma. EXAM: MRI LUMBAR SPINE WITHOUT CONTRAST TECHNIQUE: Multiplanar, multisequence MR imaging of the lumbar spine was performed. No intravenous contrast was administered. COMPARISON:  Radiography 08/11/2019 FINDINGS: Segmentation:  5 lumbar type vertebral bodies. Alignment:  Normal Vertebrae:  Normal Conus medullaris and cauda equina: Conus extends to the L1 level. Conus and cauda equina appear normal. Paraspinal and other soft tissues: Normal Disc levels: No abnormality at L3-4 or above. L4-5: Mild disc degeneration with annular bulging slightly more prominent towards the right. Mild stenosis both lateral recesses. Some potential that either L5 nerve could be affected. L5-S1: Normal interspace. IMPRESSION: Single level abnormality at L4-5 with annular bulging. Mild stenosis of both lateral recesses that would have potential to affect either or both L5 nerves. Electronically Signed   By: Nelson Chimes M.D.   On: 09/19/2019 13:39

## 2019-09-19 NOTE — ED Notes (Signed)
Patient given discharge instructions. Questions were answered. Patient verbalized understanding of discharge instructions and care at home.  

## 2019-09-19 NOTE — ED Triage Notes (Signed)
Patient complains of lower back pain that has increased over the past several months. Has taken meds for same with minimal relief. States that the pain worsened last night. MRI in july

## 2019-09-19 NOTE — ED Notes (Signed)
Pt to MRI

## 2019-09-21 ENCOUNTER — Telehealth: Payer: Self-pay | Admitting: Physical Medicine and Rehabilitation

## 2019-09-21 DIAGNOSIS — H33103 Unspecified retinoschisis, bilateral: Secondary | ICD-10-CM | POA: Diagnosis not present

## 2019-09-21 DIAGNOSIS — H5213 Myopia, bilateral: Secondary | ICD-10-CM | POA: Diagnosis not present

## 2019-09-21 NOTE — Telephone Encounter (Signed)
Patient called stating that she went to the emergency room for back pain.  She has been seeing Dr. Ninfa Linden for this issue and wanted to see about getting a shot in her back. CB#[phone].  Thank you.

## 2019-09-21 NOTE — Telephone Encounter (Signed)
Patient called and left a message. Patient need Dr. Ernestina Patches nurses to give a call back for an appointment. Please give this patient a call back as soon as possible. This message is important. Patient phone number is [phone]

## 2019-09-21 NOTE — Telephone Encounter (Signed)
Looks like was in PT here then got MRI at ED, was denied by insurance. See if right L4 TF esi can be done ie Pre-auth etc. If denied will need OV by me or Ninfa Linden

## 2019-09-21 NOTE — Telephone Encounter (Signed)
Please advise 

## 2019-09-21 NOTE — Telephone Encounter (Signed)
Called patient to advise we do have check for auth. before scheduling. She states that she is going [place] of the [place] on [place] and needs injection before then. I did advise that we will not be able to get her in this week.

## 2019-09-22 NOTE — Telephone Encounter (Signed)
Called pt insurance [employer] and spoke with Annesha N and she states no pa is needed for [phone].  Reference# [id number]  Pt is scheduled for 10/13/19 at 1:45p with driver

## 2019-09-22 NOTE — Telephone Encounter (Signed)
Pt is scheduled for 10/13/19 with driver.

## 2019-09-23 ENCOUNTER — Ambulatory Visit: Payer: BC Managed Care – PPO | Admitting: Physical Therapy

## 2019-09-23 ENCOUNTER — Other Ambulatory Visit: Payer: Self-pay

## 2019-09-23 DIAGNOSIS — M5441 Lumbago with sciatica, right side: Secondary | ICD-10-CM | POA: Diagnosis not present

## 2019-09-23 NOTE — Patient Instructions (Signed)
Access Code: [email or web address]: [email or web address]: 06/09/2021Prepared by: Aaron Edelman NelsonExercises  Lying Prone with 1 Pillow - 2 x daily - 6 x weekly - 1 reps - 1 sets - 3-5 lminutes hold  Child's Pose with Sidebending - 2 x daily - 6 x weekly - 3 sets - 30 hold  Cat Cow - 2 x daily - 6 x weekly - 2 sets - 10 reps - 5 hold  Supine Hamstring Stretch with Strap - 2 x daily - 6 x weekly - 3 reps - 1 sets - 30 hold  Supine Figure 4 Piriformis Stretch - 2 x daily - 6 x weekly - 1 sets - 3 reps - 30 hold  Hooklying Isometric Clamshell - 2-3 x daily - 7 x weekly - 1 sets - 10 reps - 10 sec hold  Supine Bridge with Resistance Band - 2-3 x daily - 7 x weekly - 1 sets - 10 reps - 5 seconds hold  Standing Sidebends - 2 x daily - 6 x weekly - 2-3 sets - 10 reps - 5 sec hold

## 2019-09-24 NOTE — Therapy (Addendum)
[name], [place], [address] Phone: [phone]   Fax:  [phone]  Physical Therapy Treatment/Discharge Summary  Patient Details  Name: [name] MRN: [id number] Date of Birth: 04/14/1971 Referring Provider (PT): Ninfa Linden, MD   Encounter Date: 09/23/2019   PT End of Session - 09/24/19 0757    Visit Number 4    Number of Visits 12    Date for PT Re-Evaluation 10/22/19    Authorization Type [employer]    PT Start Time 1345    PT Stop Time 1430    PT Time Calculation (min) 45 min    Activity Tolerance Patient tolerated treatment well    Behavior During Therapy [facility] for tasks assessed/performed           No past medical history on file.  No past surgical history on file.  There were no vitals filed for this visit.   Subjective Assessment - 09/24/19 0752    Subjective Pain was so bad she had to go to ER on 09/19/19. She also had MRI    Pertinent History no significant PMH    Limitations Lifting;Standing;Walking;House hold activities    How long can you sit comfortably? depends    How long can you stand comfortably? 15 min    How long can you walk comfortably? 15 min    Diagnostic tests MRI: Single level abnormality at L4-5 with annular bulging. Mild stenosisof both lateral recesses that would have potential to affect eitheror both L5 nerves.    Patient Stated Goals reduce pain    Currently in Pain? Yes    Pain Score 5     Pain Location Back    Pain Orientation Right;Lower    Pain Descriptors / Indicators Aching    Pain Type Acute pain    Pain Onset More than a month ago                             [name] Adult PT Treatment/Exercise - 09/24/19 0001      Lumbar Exercises: Stretches   Piriformis Stretch Right;2 reps;30 seconds    Other Lumbar Stretch Exercise lateral side bending X 10 reps to Lt for facet gapping on Rt    Other Lumbar Stretch Exercise prone lying      Lumbar  Exercises: Aerobic   Nustep L4 x 5 min      Modalities   Modalities Traction      Traction   Type of Traction Lumbar    Min (lbs) 60    Max (lbs) 70    Hold Time 60    Rest Time 20    Time 22 min intermittent                  PT Education - 09/24/19 0757    Education Details updated HEP due to new MRI findings showing disc bulge    Person(s) Educated Patient    Methods Explanation;Demonstration    Comprehension Verbalized understanding               PT Long Term Goals - 09/24/19 0801      PT LONG TERM GOAL #1   Title Pt will be I and compliant with HEP.    Baseline updated today    Time 6    Period Weeks    Status On-going      PT LONG TERM GOAL #2   Title Pt will have  painfree lumbar ROM to WNL    Baseline still with pain at 75%    Status On-going      PT LONG TERM GOAL #3   Title Pt will have overall less than 3/10 pain with usual activity    Baseline varies    Status On-going                 Plan - 09/24/19 0759    Clinical Impression Statement After new MRI results showing "Single level abnormality at L4-5 with annular bulging. Mild stenosisof both lateral recesses that would have potential to affect eitheror both L5 nerves." Her HEP was modified for extension based and left sidebending based exercises in efforts to reduce pressure on Rt lumbar nerve. Also trialed mechanical traction today. She will be going on vacation so will not return to PT for a while but showed good understanding of her HEP. She will follow back up with PT PRN.    Stability/Clinical Decision Making Stable/Uncomplicated    Rehab Potential Good    PT Frequency 2x / week    PT Duration 6 weeks    PT Treatment/Interventions ADLs/Self Care Home Management;Cryotherapy;Electrical Stimulation;Iontophoresis 57m/ml Dexamethasone;Moist Heat;Traction;Ultrasound;Gait training;Stair training;Therapeutic activities;Therapeutic exercise;Neuromuscular re-education;Manual  techniques;Passive range of motion;Dry needling;Taping;Spinal Manipulations;Joint Manipulations    PT Next Visit Plan consider traction, spinal manip, manual, DN. continue with SI stab exercises    PT Home Exercise Plan [email or web address]    Consulted and Agree with Plan of Care Patient           Patient will benefit from skilled therapeutic intervention in order to improve the following deficits and impairments:  Decreased activity tolerance, Decreased mobility, Decreased range of motion, Decreased strength, Difficulty walking, Impaired flexibility, Increased fascial restricitons, Increased muscle spasms, Postural dysfunction, Pain  Visit Diagnosis: Right-sided low back pain with right-sided sciatica, unspecified chronicity     Problem List There are no problems to display for this patient.   BDebbe Klein PT,DPT 09/24/2019, 8:02 AM  [facility] [address] [place] [address]Phone: [phone]  Fax:  [phone] Name: CBrynlei KlausnerMRN: [id number] of Birth: [birth year]    PHYSICAL THERAPY DISCHARGE SUMMARY  Visits from Start of Care: 4  Current functional level related to goals / functional outcomes: See above   Remaining deficits: unknown   Education / Equipment: HEP  Plan: Patient agrees to discharge.  Patient goals were not met. Patient is being discharged due to not returning since the last visit.  ?????     SLaureen Klein PT, DPT 11/25/19 1:51 PM  [facility] [address] [place] [address]Phone: [phone]  Fax:  [phone]

## 2019-09-25 ENCOUNTER — Encounter: Payer: BC Managed Care – PPO | Admitting: Physical Therapy

## 2019-10-05 ENCOUNTER — Encounter: Payer: BC Managed Care – PPO | Admitting: Rehabilitative and Restorative Service Providers"

## 2019-10-07 ENCOUNTER — Encounter: Payer: BC Managed Care – PPO | Admitting: Physical Therapy

## 2019-10-12 ENCOUNTER — Encounter: Payer: BC Managed Care – PPO | Admitting: Physical Therapy

## 2019-10-12 DIAGNOSIS — Z20822 Contact with and (suspected) exposure to covid-19: Secondary | ICD-10-CM | POA: Diagnosis not present

## 2019-10-13 ENCOUNTER — Encounter: Payer: Self-pay | Admitting: Physical Medicine and Rehabilitation

## 2019-10-13 ENCOUNTER — Other Ambulatory Visit: Payer: Self-pay

## 2019-10-13 ENCOUNTER — Ambulatory Visit: Payer: BC Managed Care – PPO | Admitting: Physical Medicine and Rehabilitation

## 2019-10-13 ENCOUNTER — Ambulatory Visit: Payer: BC Managed Care – PPO | Admitting: Orthopaedic Surgery

## 2019-10-13 ENCOUNTER — Ambulatory Visit: Payer: Self-pay

## 2019-10-13 VITALS — BP 116/79 | HR 96

## 2019-10-13 DIAGNOSIS — M5116 Intervertebral disc disorders with radiculopathy, lumbar region: Secondary | ICD-10-CM | POA: Diagnosis not present

## 2019-10-13 MED ORDER — METHYLPREDNISOLONE ACETATE 80 MG/ML IJ SUSP
40.0000 mg | Freq: Once | INTRAMUSCULAR | Status: AC
  Administered 2019-10-13: 40 mg

## 2019-10-13 NOTE — Progress Notes (Signed)
[name] - 49 y.o. female MRN [id number]  Date of birth: 1970/06/19  Office Visit Note: Visit Date: 10/13/2019 PCP: Mayra Neer, MD Referred by: Mayra Neer, MD  Subjective: Chief Complaint  Patient presents with  . Lower Back - Pain   HPI:  [name] is a 49 y.o. female who comes in today at the request of Dr. Jean Rosenthal for planned Right L4-L5 Lumbar epidural steroid injection with fluoroscopic guidance.  The patient has failed conservative care including home exercise, medications, time and activity modification.  This injection will be diagnostic and hopefully therapeutic.  Please see requesting physician notes for further details and justification.  MRI reviewed with images and spine model.  MRI reviewed in the note below.  Consider repeat vs L5-S1 interlaminar epidural.   ROS Otherwise per HPI.  Assessment & Plan: Visit Diagnoses:  1. Radiculopathy due to lumbar intervertebral disc disorder     Plan: No additional findings.   Meds & Orders:  Meds ordered this encounter  Medications  . methylPREDNISolone acetate (DEPO-MEDROL) injection 40 mg    Orders Placed This Encounter  Procedures  . XR C-ARM NO REPORT  . Epidural Steroid injection    Follow-up: Return for Jean Rosenthal, MD as scheduled.   Procedures: No procedures performed  Lumbosacral Transforaminal Epidural Steroid Injection - Sub-Pedicular Approach with Fluoroscopic Guidance  Patient: [name]      Date of Birth: 1970-07-31 MRN: [id number] PCP: Mayra Neer, MD      Visit Date: 10/13/2019   Universal Protocol:    Date/Time: 10/13/2019  Consent Given By: the patient  Position: PRONE  Additional Comments: Vital signs were monitored before and after the procedure. Patient was prepped and draped in the usual sterile fashion. The correct patient, procedure, and site was verified.   Injection Procedure Details:  Procedure Site  One Meds Administered:  Meds ordered this encounter  Medications  . methylPREDNISolone acetate (DEPO-MEDROL) injection 40 mg    Laterality: Right  Location/Site:  L4-L5  Needle size: 22 G  Needle type: Spinal  Needle Placement: Transforaminal  Findings:    -Comments: Excellent flow of contrast along the nerve, nerve root and into the epidural space.  Procedure Details: After squaring off the end-plates to get a true AP view, the C-arm was positioned so that an oblique view of the foramen as noted above was visualized. The target area is just inferior to the "nose of the scotty dog" or sub pedicular. The soft tissues overlying this structure were infiltrated with 2-3 ml. of 1% Lidocaine without Epinephrine.  The spinal needle was inserted toward the target using a "trajectory" view along the fluoroscope beam.  Under AP and lateral visualization, the needle was advanced so it did not puncture dura and was located close the 6 O'Clock position of the pedical in AP tracterory. Biplanar projections were used to confirm position. Aspiration was confirmed to be negative for CSF and/or blood. A 1-2 ml. volume of Isovue-250 was injected and flow of contrast was noted at each level. Radiographs were obtained for documentation purposes.   After attaining the desired flow of contrast documented above, a 0.5 to 1.0 ml test dose of 0.25% Marcaine was injected into each respective transforaminal space.  The patient was observed for 90 seconds post injection.  After no sensory deficits were reported, and normal lower extremity motor function was noted,   the above injectate was administered so that equal amounts of the injectate were placed at each  foramen (level) into the transforaminal epidural space.   Additional Comments:  The patient tolerated the procedure well Dressing: 2 x 2 sterile gauze and Band-Aid    Post-procedure details: Patient was observed during the procedure. Post-procedure  instructions were reviewed.  Patient left the clinic in stable condition.      Clinical History: CLINICAL DATA:  Worsening right-sided low back pain and right leg weakness. Recent trauma.  EXAM: MRI LUMBAR SPINE WITHOUT CONTRAST  TECHNIQUE: Multiplanar, multisequence MR imaging of the lumbar spine was performed. No intravenous contrast was administered.  COMPARISON:  Radiography 08/11/2019  FINDINGS: Segmentation:  5 lumbar type vertebral bodies.  Alignment:  Normal  Vertebrae:  Normal  Conus medullaris and cauda equina: Conus extends to the L1 level. Conus and cauda equina appear normal.  Paraspinal and other soft tissues: Normal  Disc levels:  No abnormality at L3-4 or above.  L4-5: Mild disc degeneration with annular bulging slightly more prominent towards the right. Mild stenosis both lateral recesses. Some potential that either L5 nerve could be affected.  L5-S1: Normal interspace.  IMPRESSION: Single level abnormality at L4-5 with annular bulging. Mild stenosis of both lateral recesses that would have potential to affect either or both L5 nerves.   Electronically Signed   By: Nelson Chimes M.D.   On: 09/19/2019 13:39     Objective:  VS:  HT:    WT:   BMI:     BP:116/79  HR:96bpm  TEMP: ( )  RESP:  Physical Exam Constitutional:      General: She is not in acute distress.    Appearance: Normal appearance. She is not ill-appearing.  HENT:     Head: Normocephalic and atraumatic.     Right Ear: External ear normal.     Left Ear: External ear normal.  Eyes:     Extraocular Movements: Extraocular movements intact.  Cardiovascular:     Rate and Rhythm: Normal rate.     Pulses: Normal pulses.  Musculoskeletal:     Right lower leg: No edema.     Left lower leg: No edema.     Comments: Patient has good distal strength with no pain over the greater trochanters.  No clonus or focal weakness.  Skin:    Findings: No erythema, lesion  or rash.  Neurological:     General: No focal deficit present.     Mental Status: She is alert and oriented to person, place, and time.     Sensory: No sensory deficit.     Motor: No weakness or abnormal muscle tone.     Coordination: Coordination normal.  Psychiatric:        Mood and Affect: Mood normal.        Behavior: Behavior normal.      Imaging: No results found.

## 2019-10-13 NOTE — Procedures (Signed)
Lumbosacral Transforaminal Epidural Steroid Injection - Sub-Pedicular Approach with Fluoroscopic Guidance  Patient: [name]      Date of Birth: 1970-09-13 MRN: [id number] PCP: Mayra Neer, MD      Visit Date: 10/13/2019   Universal Protocol:    Date/Time: 10/13/2019  Consent Given By: the patient  Position: PRONE  Additional Comments: Vital signs were monitored before and after the procedure. Patient was prepped and draped in the usual sterile fashion. The correct patient, procedure, and site was verified.   Injection Procedure Details:  Procedure Site One Meds Administered:  Meds ordered this encounter  Medications  . methylPREDNISolone acetate (DEPO-MEDROL) injection 40 mg    Laterality: Right  Location/Site:  L4-L5  Needle size: 22 G  Needle type: Spinal  Needle Placement: Transforaminal  Findings:    -Comments: Excellent flow of contrast along the nerve, nerve root and into the epidural space.  Procedure Details: After squaring off the end-plates to get a true AP view, the C-arm was positioned so that an oblique view of the foramen as noted above was visualized. The target area is just inferior to the "nose of the scotty dog" or sub pedicular. The soft tissues overlying this structure were infiltrated with 2-3 ml. of 1% Lidocaine without Epinephrine.  The spinal needle was inserted toward the target using a "trajectory" view along the fluoroscope beam.  Under AP and lateral visualization, the needle was advanced so it did not puncture dura and was located close the 6 O'Clock position of the pedical in AP tracterory. Biplanar projections were used to confirm position. Aspiration was confirmed to be negative for CSF and/or blood. A 1-2 ml. volume of Isovue-250 was injected and flow of contrast was noted at each level. Radiographs were obtained for documentation purposes.   After attaining the desired flow of contrast documented above, a 0.5 to 1.0  ml test dose of 0.25% Marcaine was injected into each respective transforaminal space.  The patient was observed for 90 seconds post injection.  After no sensory deficits were reported, and normal lower extremity motor function was noted,   the above injectate was administered so that equal amounts of the injectate were placed at each foramen (level) into the transforaminal epidural space.   Additional Comments:  The patient tolerated the procedure well Dressing: 2 x 2 sterile gauze and Band-Aid    Post-procedure details: Patient was observed during the procedure. Post-procedure instructions were reviewed.  Patient left the clinic in stable condition.

## 2019-10-13 NOTE — Progress Notes (Signed)
Pt states pain in the lower back on the right side that radiates into the right buttocks. Pain started towards the end of April 2021. Standing, sitting, and walking makes pain worse. Lidocaine patches helps with pain.   .Numeric Pain Rating Scale and Functional Assessment Average Pain 5   In the last MONTH (on 0-10 scale) has pain interfered with the following?  1. General activity like being  able to carry out your everyday physical activities such as walking, climbing stairs, carrying groceries, or moving a chair?  Rating(10)   +Driver, -BT, -Dye Allergies.

## 2019-10-14 ENCOUNTER — Encounter: Payer: BC Managed Care – PPO | Admitting: Rehabilitative and Restorative Service Providers"

## 2019-10-16 ENCOUNTER — Other Ambulatory Visit: Payer: BC Managed Care – PPO

## 2019-10-20 ENCOUNTER — Encounter: Payer: Self-pay | Admitting: Orthopaedic Surgery

## 2019-10-20 ENCOUNTER — Ambulatory Visit: Payer: BC Managed Care – PPO | Admitting: Orthopaedic Surgery

## 2019-10-20 ENCOUNTER — Other Ambulatory Visit: Payer: Self-pay

## 2019-10-20 DIAGNOSIS — M5441 Lumbago with sciatica, right side: Secondary | ICD-10-CM | POA: Diagnosis not present

## 2019-10-20 DIAGNOSIS — M5442 Lumbago with sciatica, left side: Secondary | ICD-10-CM | POA: Diagnosis not present

## 2019-10-20 DIAGNOSIS — M4807 Spinal stenosis, lumbosacral region: Secondary | ICD-10-CM

## 2019-10-20 NOTE — Progress Notes (Signed)
The patient comes in today 1 week after an epidural steroid injection by Dr. Ernestina Patches.  This is in the lower lumbar spine.  She said that she is really had some good relief from that.  She has a little bit of pain still with bending and she is working on a home exercise program that therapy gave her.  She is just taking it easy overall.  She not having take medications and overall she appears more comfortable and she states that she feels more comfortable.  On exam she is just a mildly positive straight leg raise to the left side.  Otherwise her remainding exam shows no significant findings.  At this point follow-up can be as needed.  All questions and concerns were answered and addressed.  If things worsen in any way she will let us know.  I told her to call us if she would like to consider another injection down the road because we would easily get her in with Dr. Ernestina Patches to schedule that.

## 2020-01-18 DIAGNOSIS — Z79899 Other long term (current) drug therapy: Secondary | ICD-10-CM | POA: Diagnosis not present

## 2020-01-18 DIAGNOSIS — Z0000 Encounter for general adult medical examination without abnormal findings: Secondary | ICD-10-CM | POA: Diagnosis not present

## 2020-01-18 DIAGNOSIS — Z13220 Encounter for screening for lipoid disorders: Secondary | ICD-10-CM | POA: Diagnosis not present

## 2020-01-18 DIAGNOSIS — L659 Nonscarring hair loss, unspecified: Secondary | ICD-10-CM | POA: Diagnosis not present

## 2020-01-26 ENCOUNTER — Encounter: Payer: Self-pay | Admitting: Gastroenterology

## 2020-03-18 ENCOUNTER — Other Ambulatory Visit: Payer: Self-pay

## 2020-03-18 ENCOUNTER — Ambulatory Visit (AMBULATORY_SURGERY_CENTER): Payer: Self-pay | Admitting: *Deleted

## 2020-03-18 VITALS — Ht 61.0 in | Wt 168.0 lb

## 2020-03-18 DIAGNOSIS — Z1211 Encounter for screening for malignant neoplasm of colon: Secondary | ICD-10-CM

## 2020-03-18 MED ORDER — SUTAB 1479-225-188 MG PO TABS: 24.0000 | ORAL_TABLET | ORAL | 0 refills | Status: DC

## 2020-03-18 NOTE — Progress Notes (Signed)
No egg or soy allergy known to patient   issues with past sedation with any surgeries or procedures- BP dropped with c section  no intubation problems in the past  No FH of Malignant Hyperthermia No diet pills per patient No home 02 use per patient  No blood thinners per patient  Pt denies issues with constipation  No A fib or A flutter  EMMI video to pt or via [place] 19 guidelines implemented in PV today with Pt and RN   [place]  Coupon given to pt in PV today , Code to Pharmacy   Due to the COVID-19 pandemic we are asking patients to follow these guidelines. Please only bring one care partner. Please be aware that your care partner may wait in the car in the parking lot or if they feel like they will be too hot to wait in the car, they may wait in the lobby on the 4th floor. All care partners are required to wear a mask the entire time (we do not have any that we can provide them), they need to practice social distancing, and we will do a Covid check for all patient's and care partners when you arrive. Also we will check their temperature and your temperature. If the care partner waits in their car they need to stay in the parking lot the entire time and we will call them on their cell phone when the patient is ready for discharge so they can bring the car to the front of the building. Also all patient's will need to wear a mask into building.

## 2020-03-21 ENCOUNTER — Encounter: Payer: Self-pay | Admitting: Gastroenterology

## 2020-03-21 DIAGNOSIS — Z6832 Body mass index (BMI) 32.0-32.9, adult: Secondary | ICD-10-CM | POA: Diagnosis not present

## 2020-03-21 DIAGNOSIS — Z1231 Encounter for screening mammogram for malignant neoplasm of breast: Secondary | ICD-10-CM | POA: Diagnosis not present

## 2020-03-21 DIAGNOSIS — Z01419 Encounter for gynecological examination (general) (routine) without abnormal findings: Secondary | ICD-10-CM | POA: Diagnosis not present

## 2020-03-30 DIAGNOSIS — Z1159 Encounter for screening for other viral diseases: Secondary | ICD-10-CM | POA: Diagnosis not present

## 2020-03-31 ENCOUNTER — Telehealth: Payer: Self-pay | Admitting: *Deleted

## 2020-03-31 NOTE — Telephone Encounter (Signed)
Spoke with Chatell at [facility]- covid test 03-30-20 negative

## 2020-04-01 ENCOUNTER — Other Ambulatory Visit: Payer: Self-pay

## 2020-04-01 ENCOUNTER — Ambulatory Visit (AMBULATORY_SURGERY_CENTER): Payer: BC Managed Care – PPO | Admitting: Gastroenterology

## 2020-04-01 ENCOUNTER — Encounter: Payer: Self-pay | Admitting: Gastroenterology

## 2020-04-01 VITALS — BP 123/70 | HR 67 | Temp 98.0°F | Resp 14 | Ht 61.0 in | Wt 168.0 lb

## 2020-04-01 DIAGNOSIS — D123 Benign neoplasm of transverse colon: Secondary | ICD-10-CM

## 2020-04-01 DIAGNOSIS — D122 Benign neoplasm of ascending colon: Secondary | ICD-10-CM

## 2020-04-01 DIAGNOSIS — Z1211 Encounter for screening for malignant neoplasm of colon: Secondary | ICD-10-CM | POA: Diagnosis not present

## 2020-04-01 DIAGNOSIS — K635 Polyp of colon: Secondary | ICD-10-CM | POA: Diagnosis not present

## 2020-04-01 DIAGNOSIS — D125 Benign neoplasm of sigmoid colon: Secondary | ICD-10-CM

## 2020-04-01 DIAGNOSIS — D120 Benign neoplasm of cecum: Secondary | ICD-10-CM

## 2020-04-01 MED ORDER — SODIUM CHLORIDE 0.9 % IV SOLN: 500.0000 mL | Freq: Once | INTRAVENOUS | Status: DC

## 2020-04-01 NOTE — Progress Notes (Signed)
Report to PACU, RN, vss, BBS= Clear.  

## 2020-04-01 NOTE — Progress Notes (Signed)
Called to room to assist during endoscopic procedure.  Patient ID and intended procedure confirmed with present staff. Received instructions for my participation in the procedure from the performing physician.  

## 2020-04-01 NOTE — Progress Notes (Signed)
No problems noted in the recovery room. maw 

## 2020-04-01 NOTE — Progress Notes (Signed)
Medical history reviewed with no changes noted. VS assessed by C.W 

## 2020-04-01 NOTE — Op Note (Addendum)
[place] Patient Name: [name] Procedure Date: 04/01/2020 7:46 AM MRN: [id number] Endoscopist: Remo Lipps P. Havery Moros , MD Age: 49 Referring MD:  Date of Birth: January 28, 1971 Gender: Female Account #: [id number] Procedure:                Colonoscopy Indications:              Screening for colorectal malignant neoplasm, This                            is the patient's first colonoscopy Medicines:                Monitored Anesthesia Care Procedure:                Pre-Anesthesia Assessment:                           - Prior to the procedure, a History and Physical                            was performed, and patient medications and                            allergies were reviewed. The patient's tolerance of                            previous anesthesia was also reviewed. The risks                            and benefits of the procedure and the sedation                            options and risks were discussed with the patient.                            All questions were answered, and informed consent                            was obtained. Prior Anticoagulants: The patient has                            taken no previous anticoagulant or antiplatelet                            agents. ASA Grade Assessment: II - A patient with                            mild systemic disease. After reviewing the risks                            and benefits, the patient was deemed in                            satisfactory condition to undergo the procedure.  After obtaining informed consent, the colonoscope                            was passed under direct vision. Throughout the                            procedure, the patient's blood pressure, pulse, and                            oxygen saturations were monitored continuously. The                            Olympus PCF-H190DL [id number]) Colonoscope was                            introduced through  the anus and advanced to the the                            cecum, identified by appendiceal orifice and                            ileocecal valve. The colonoscopy was performed                            without difficulty. The patient tolerated the                            procedure well. The quality of the bowel                            preparation was good. The ileocecal valve,                            appendiceal orifice, and rectum were photographed. Scope In: 7:56:31 AM Scope Out: 8:15:44 AM Scope Withdrawal Time: 0 hours 16 minutes 16 seconds  Total Procedure Duration: 0 hours 19 minutes 13 seconds  Findings:                 The perianal and digital rectal examinations were                            normal.                           Three flat and sessile polyps were found in the                            cecum. The polyps were 1 to 4 mm in size. These                            polyps were removed with a cold snare. Resection                            and retrieval were complete.  A 3 mm polyp was found in the ascending colon. The                            polyp was sessile. The polyp was removed with a                            cold snare. Resection and retrieval were complete.                           A 4 mm polyp was found in the transverse colon. The                            polyp was flat. The polyp was removed with a cold                            snare. Resection and retrieval were complete.                           A 6 mm polyp was found in the sigmoid colon. The                            polyp was flat. The polyp was removed with a cold                            snare. Resection and retrieval were complete.                           Anal papilla(e) were hypertrophied.                           Internal hemorrhoids were found during                            retroflexion. The hemorrhoids were small.                            The exam was otherwise without abnormality. Complications:            No immediate complications. Estimated blood loss:                            Minimal. Estimated Blood Loss:     Estimated blood loss was minimal. Impression:               - Three 1 to 4 mm polyps in the cecum, removed with                            a cold snare. Resected and retrieved.                           - One 3 mm polyp in the ascending colon, removed  with a cold snare. Resected and retrieved.                           - One 4 mm polyp in the transverse colon, removed                            with a cold snare. Resected and retrieved.                           - One 6 mm polyp in the sigmoid colon, removed with                            a cold snare. Resected and retrieved.                           - Anal papilla(e) were hypertrophied.                           - Internal hemorrhoids.                           - The examination was otherwise normal. Recommendation:           - Patient has a contact number available for                            emergencies. The signs and symptoms of potential                            delayed complications were discussed with the                            patient. Return to normal activities tomorrow.                            Written discharge instructions were provided to the                            patient.                           - Resume previous diet.                           - Continue present medications.                           - Await pathology results. Remo Lipps P. Julies Carmickle, MD 04/01/2020 8:20:54 AM This report has been signed electronically.

## 2020-04-01 NOTE — Patient Instructions (Addendum)
Handouts were given to you on polyps and hemorrhoids. You may resume your current medications today. Await biopsy results. Please call if any questions or concerns.     YOU HAD AN ENDOSCOPIC PROCEDURE TODAY AT [facility]:   Refer to the procedure report that was given to you for any specific questions about what was found during the examination.  If the procedure report does not answer your questions, please call your gastroenterologist to clarify.  If you requested that your care partner not be given the details of your procedure findings, then the procedure report has been included in a sealed envelope for you to review at your convenience later.  YOU SHOULD EXPECT: Some feelings of bloating in the abdomen. Passage of more gas than usual.  Walking can help get rid of the air that was put into your GI tract during the procedure and reduce the bloating. If you had a lower endoscopy (such as a colonoscopy or flexible sigmoidoscopy) you may notice spotting of blood in your stool or on the toilet paper. If you underwent a bowel prep for your procedure, you may not have a normal bowel movement for a few days.  Please Note:  You might notice some irritation and congestion in your nose or some drainage.  This is from the oxygen used during your procedure.  There is no need for concern and it should clear up in a day or so.  SYMPTOMS TO REPORT IMMEDIATELY:   Following lower endoscopy (colonoscopy or flexible sigmoidoscopy):  Excessive amounts of blood in the stool  Significant tenderness or worsening of abdominal pains  Swelling of the abdomen that is new, acute  Fever of 100F or higher   For urgent or emergent issues, a gastroenterologist can be reached at any hour by calling [phone]. Do not use MyChart messaging for urgent concerns.    DIET:  We do recommend a small meal at first, but then you may proceed to your regular diet.  Drink plenty of fluids but you should  avoid alcoholic beverages for 24 hours.  ACTIVITY:  You should plan to take it easy for the rest of today and you should NOT DRIVE or use heavy machinery until tomorrow (because of the sedation medicines used during the test).    FOLLOW UP: Our staff will call the number listed on your records 48-72 hours following your procedure to check on you and address any questions or concerns that you may have regarding the information given to you following your procedure. If we do not reach you, we will leave a message.  We will attempt to reach you two times.  During this call, we will ask if you have developed any symptoms of COVID 19. If you develop any symptoms (ie: fever, flu-like symptoms, shortness of breath, cough etc.) before then, please call [phone].  If you test positive for Covid 19 in the 2 weeks post procedure, please call and report this information to [place].    If any biopsies were taken you will be contacted by phone or by letter within the next 1-3 weeks.  Please call us at [phone] if you have not heard about the biopsies in 3 weeks.    SIGNATURES/CONFIDENTIALITY: You and/or your care partner have signed paperwork which will be entered into your electronic medical record.  These signatures attest to the fact that that the information above on your After Visit Summary has been reviewed and is understood.  Full responsibility of  the confidentiality of this discharge information lies with you and/or your care-partner.

## 2020-04-05 ENCOUNTER — Telehealth: Payer: Self-pay

## 2020-04-05 NOTE — Telephone Encounter (Signed)
  Follow up Call-  Call back number 04/01/2020  Post procedure Call Back phone  # [phone]  Permission to leave phone message Yes  Some recent data might be hidden     Patient questions:  Do you have a fever, pain , or abdominal swelling? No. Pain Score  0 *  Have you tolerated food without any problems? Yes.    Have you been able to return to your normal activities? Yes.    Do you have any questions about your discharge instructions: Diet   No. Medications  No. Follow up visit  No.  Do you have questions or concerns about your Care? No.  Actions: * If pain score is 4 or above: No action needed, pain <4.  1. Have you developed a fever since your procedure? no  2.   Have you had an respiratory symptoms (SOB or cough) since your procedure? no  3.   Have you tested positive for COVID 19 since your procedure no  4.   Have you had any family members/close contacts diagnosed with the COVID 19 since your procedure?  no   If yes to any of these questions please route to Joylene John, RN and Joella Prince, RN

## 2020-04-16 DIAGNOSIS — C801 Malignant (primary) neoplasm, unspecified: Secondary | ICD-10-CM

## 2020-04-16 HISTORY — DX: Malignant (primary) neoplasm, unspecified: C80.1

## 2020-07-06 ENCOUNTER — Telehealth: Payer: Self-pay | Admitting: Physical Medicine and Rehabilitation

## 2020-07-06 NOTE — Telephone Encounter (Signed)
ok 

## 2020-07-06 NOTE — Telephone Encounter (Signed)
Right L4 TF 10/13/19. Ok to repeat if helped, same problem/side, and no new injury?

## 2020-07-06 NOTE — Telephone Encounter (Signed)
Patient called requesting a call back to set an appt for an injection. Patient did not specify where she needed the injection. Please call patient at [phone].

## 2020-07-07 NOTE — Telephone Encounter (Signed)
Is auth needed for repeat right L4 TF? Scheduled for 3/31 with driver.

## 2020-07-11 NOTE — Telephone Encounter (Signed)
Pt not req auth#

## 2020-07-14 ENCOUNTER — Ambulatory Visit: Payer: Self-pay

## 2020-07-14 ENCOUNTER — Encounter: Payer: Self-pay | Admitting: Physical Medicine and Rehabilitation

## 2020-07-14 ENCOUNTER — Ambulatory Visit: Payer: BC Managed Care – PPO | Admitting: Physical Medicine and Rehabilitation

## 2020-07-14 ENCOUNTER — Other Ambulatory Visit: Payer: Self-pay

## 2020-07-14 VITALS — BP 123/84 | HR 67

## 2020-07-14 DIAGNOSIS — M5416 Radiculopathy, lumbar region: Secondary | ICD-10-CM

## 2020-07-14 MED ORDER — METHYLPREDNISOLONE ACETATE 80 MG/ML IJ SUSP
80.0000 mg | Freq: Once | INTRAMUSCULAR | Status: AC
  Administered 2020-07-14: 80 mg

## 2020-07-14 NOTE — Progress Notes (Signed)
Pt state lower back pain that travels to her left butt side. Pt statw alking and standing makes the pain worse. Pt sate she uses ice and sometime over the counter pain meds.  Pt has hx of inj on 10/13/19 pt state it helped a little for a few months.  Numeric Pain Rating Scale and Functional Assessment Average Pain 2   In the last MONTH (on 0-10 scale) has pain interfered with the following?  1. General activity like being  able to carry out your everyday physical activities such as walking, climbing stairs, carrying groceries, or moving a chair?  Rating(8)   +Driver, -BT, -Dye Allergies.

## 2020-07-14 NOTE — Procedures (Signed)
Lumbosacral Transforaminal Epidural Steroid Injection - Sub-Pedicular Approach with Fluoroscopic Guidance  Patient: [name]      Date of Birth: [birth year] MRN: [id number] PCP: Mayra Neer, MD      Visit Date: 07/14/2020   Universal Protocol:    Date/Time: 07/14/2020  Consent Given By: the patient  Position: PRONE  Additional Comments: Vital signs were monitored before and after the procedure. Patient was prepped and draped in the usual sterile fashion. The correct patient, procedure, and site was verified.   Injection Procedure Details:   Procedure diagnoses: Lumbar radiculopathy [M54.16]    Meds Administered:  Meds ordered this encounter  Medications  . methylPREDNISolone acetate (DEPO-MEDROL) injection 80 mg    Laterality: Right  Location/Site:  L4-L5  Needle:5.0 in., 22 ga.  Short bevel or Quincke spinal needle  Needle Placement: Transforaminal  Findings:    -Comments: Excellent flow of contrast along the nerve, nerve root and into the epidural space.  Procedure Details: After squaring off the end-plates to get a true AP view, the C-arm was positioned so that an oblique view of the foramen as noted above was visualized. The target area is just inferior to the "nose of the scotty dog" or sub pedicular. The soft tissues overlying this structure were infiltrated with 2-3 ml. of 1% Lidocaine without Epinephrine.  The spinal needle was inserted toward the target using a "trajectory" view along the fluoroscope beam.  Under AP and lateral visualization, the needle was advanced so it did not puncture dura and was located close the 6 O'Clock position of the pedical in AP tracterory. Biplanar projections were used to confirm position. Aspiration was confirmed to be negative for CSF and/or blood. A 1-2 ml. volume of Isovue-250 was injected and flow of contrast was noted at each level. Radiographs were obtained for documentation purposes.   After attaining the  desired flow of contrast documented above, a 0.5 to 1.0 ml test dose of 0.25% Marcaine was injected into each respective transforaminal space.  The patient was observed for 90 seconds post injection.  After no sensory deficits were reported, and normal lower extremity motor function was noted,   the above injectate was administered so that equal amounts of the injectate were placed at each foramen (level) into the transforaminal epidural space.   Additional Comments:  The patient tolerated the procedure well Dressing: 2 x 2 sterile gauze and Band-Aid    Post-procedure details: Patient was observed during the procedure. Post-procedure instructions were reviewed.  Patient left the clinic in stable condition.

## 2020-07-14 NOTE — Patient Instructions (Signed)

## 2020-07-14 NOTE — Progress Notes (Signed)
[name] - 50 y.o. female MRN [id number]  Date of birth: 1970-09-25  Office Visit Note: Visit Date: 07/14/2020 PCP: Mayra Neer, MD Referred by: Mayra Neer, MD  Subjective: Chief Complaint  Patient presents with  . Lower Back - Pain   HPI:  [name] is a 50 y.o. female who comes in today for planned repeat Right L4-L5 Lumbar epidural steroid injection with fluoroscopic guidance.  The patient has failed conservative care including home exercise, medications, time and activity modification.  This injection will be diagnostic and hopefully therapeutic.  Please see requesting physician notes for further details and justification. Prior injection did offer more than 50% relief as noted above and did give the patient more functional ability for activities of daily living and was also beneficial in that it did reduce their medication requirement.  They have had physical therapy and continue with home exercises.  Current medication management is not beneficial in increasing their functional status.  Procedures are done as part of a comprehensive orthopedic and pain management program with access to in-house orthopedics, spine surgery and physical therapy as well as access to [facility] if needed.  Depending on amount of relief would try L5-S1 interlam versus right L5 transforaminal epidural.  Referring: Dr. Jean Rosenthal    ROS Otherwise per HPI.  Assessment & Plan: Visit Diagnoses:    ICD-10-CM   1. Lumbar radiculopathy  M54.16 XR C-ARM NO REPORT    Epidural Steroid injection    methylPREDNISolone acetate (DEPO-MEDROL) injection 80 mg    Plan: No additional findings.   Meds & Orders:  Meds ordered this encounter  Medications  . methylPREDNISolone acetate (DEPO-MEDROL) injection 80 mg    Orders Placed This Encounter  Procedures  . XR C-ARM NO REPORT  . Epidural Steroid injection    Follow-up:  Return if symptoms worsen or fail to improve.   Procedures: No procedures performed      Clinical History: CLINICAL DATA:  Worsening right-sided low back pain and right leg weakness. Recent trauma.  EXAM: MRI LUMBAR SPINE WITHOUT CONTRAST  TECHNIQUE: Multiplanar, multisequence MR imaging of the lumbar spine was performed. No intravenous contrast was administered.  COMPARISON:  Radiography 08/11/2019  FINDINGS: Segmentation:  5 lumbar type vertebral bodies.  Alignment:  Normal  Vertebrae:  Normal  Conus medullaris and cauda equina: Conus extends to the L1 level. Conus and cauda equina appear normal.  Paraspinal and other soft tissues: Normal  Disc levels:  No abnormality at L3-4 or above.  L4-5: Mild disc degeneration with annular bulging slightly more prominent towards the right. Mild stenosis both lateral recesses. Some potential that either L5 nerve could be affected.  L5-S1: Normal interspace.  IMPRESSION: Single level abnormality at L4-5 with annular bulging. Mild stenosis of both lateral recesses that would have potential to affect either or both L5 nerves.   Electronically Signed   By: Nelson Chimes M.D.   On: 09/19/2019 13:39     Objective:  VS:  HT:    WT:   BMI:     BP:123/84  HR:67bpm  TEMP: ( )  RESP:  Physical Exam Vitals and nursing note reviewed.  Constitutional:      General: She is not in acute distress.    Appearance: Normal appearance. She is not ill-appearing.  HENT:     Head: Normocephalic and atraumatic.     Right Ear: External ear normal.     Left Ear: External ear normal.  Eyes:     Extraocular Movements: Extraocular movements intact.  Cardiovascular:     Rate and Rhythm: Normal rate.     Pulses: Normal pulses.  Pulmonary:     Effort: Pulmonary effort is normal. No respiratory distress.  Abdominal:     General: There is no distension.     Palpations: Abdomen is soft.  Musculoskeletal:        General:  Tenderness present.     Cervical back: Neck supple.     Right lower leg: No edema.     Left lower leg: No edema.     Comments: Patient has good distal strength with no pain over the greater trochanters.  No clonus or focal weakness.  Skin:    Findings: No erythema, lesion or rash.  Neurological:     General: No focal deficit present.     Mental Status: She is alert and oriented to person, place, and time.     Sensory: No sensory deficit.     Motor: No weakness or abnormal muscle tone.     Coordination: Coordination normal.  Psychiatric:        Mood and Affect: Mood normal.        Behavior: Behavior normal.      Imaging: No results found.

## 2020-09-09 ENCOUNTER — Other Ambulatory Visit: Payer: Self-pay | Admitting: Orthopaedic Surgery

## 2020-09-09 DIAGNOSIS — M4807 Spinal stenosis, lumbosacral region: Secondary | ICD-10-CM

## 2020-09-19 ENCOUNTER — Other Ambulatory Visit: Payer: Self-pay

## 2020-09-19 ENCOUNTER — Ambulatory Visit: Payer: BC Managed Care – PPO | Admitting: Orthopaedic Surgery

## 2020-09-19 ENCOUNTER — Encounter: Payer: Self-pay | Admitting: Orthopaedic Surgery

## 2020-09-19 ENCOUNTER — Ambulatory Visit: Payer: Self-pay

## 2020-09-19 DIAGNOSIS — M7712 Lateral epicondylitis, left elbow: Secondary | ICD-10-CM | POA: Diagnosis not present

## 2020-09-19 DIAGNOSIS — M25522 Pain in left elbow: Secondary | ICD-10-CM

## 2020-09-19 MED ORDER — METHYLPREDNISOLONE 4 MG PO TABS: ORAL_TABLET | ORAL | 0 refills | Status: DC

## 2020-09-19 MED ORDER — LIDOCAINE HCL 1 % IJ SOLN
1.0000 mL | INTRAMUSCULAR | Status: AC | PRN
  Administered 2020-09-19: 1 mL

## 2020-09-19 MED ORDER — METHYLPREDNISOLONE ACETATE 40 MG/ML IJ SUSP
40.0000 mg | INTRAMUSCULAR | Status: AC | PRN
  Administered 2020-09-19: 40 mg

## 2020-09-19 MED ORDER — CELECOXIB 200 MG PO CAPS: 200.0000 mg | ORAL_CAPSULE | Freq: Two times a day (BID) | ORAL | 3 refills | Status: DC | PRN

## 2020-09-19 NOTE — Progress Notes (Signed)
Office Visit Note   Patient: [name]           Date of Birth: 06-21-1970           MRN: [id number] Visit Date: 09/19/2020              Requested by: Mayra Neer, MD [address] [place],  [place] [address] PCP: Mayra Neer, MD   Assessment & Plan: Visit Diagnoses:  1. Pain in left elbow   2. Lateral epicondylitis, left elbow     Plan: I did place a steroid injection around her left elbow epicondyle area.  I want her to avoid lifting things with her palm down on the left side.  I will try a steroid taper for her back as well as this area and Celebrex twice daily.  Also wanted to try Voltaren gel twice daily over this area.  All questions and concerns were answered and addressed.  I would like to see her back in 2 weeks for repeat exam and to see how she is doing overall.  Follow-Up Instructions: Return in about 2 weeks (around 10/03/2020).   Orders:  Orders Placed This Encounter  Procedures  . Hand/UE Inj  . XR Elbow Complete Left (3+View)   Meds ordered this encounter  Medications  . methylPREDNISolone (MEDROL) 4 MG tablet    Sig: Medrol dose pack. Take as instructed    Dispense:  21 tablet    Refill:  0  . celecoxib (CELEBREX) 200 MG capsule    Sig: Take 1 capsule (200 mg total) by mouth 2 (two) times daily between meals as needed.    Dispense:  60 capsule    Refill:  3      Procedures: Hand/UE Inj: L elbow for lateral epicondylitis on 09/19/2020 4:03 PM Medications: 1 mL lidocaine 1 %; 40 mg methylPREDNISolone acetate 40 MG/ML      Clinical Data: No additional findings.   Subjective: Chief Complaint  Patient presents with  . Left Elbow - Pain  Patient is a very pleasant 50 year old female that I seen before.  She comes in with acute left elbow pain when she injured that elbow about 2 weeks ago.  She was picking up a drink and she felt a pulling sensation along the lateral aspect of her right elbow.  She is also had some  numbness and tingling in her fingers and the elbow and is now some pain in the left shoulder.  She has a history of lateral epicondylitis on the right side that did eventually require surgery years ago.  She had previously not had any issues with her left elbow.  She is not a diabetic.  HPI  Review of Systems There is currently listed no headache, chest pain, shortness of breath, fever, chills, nausea, vomiting  Objective: Vital Signs: There were no vitals taken for this visit.  Physical Exam She is alert and orient x3 and in no acute distress Ortho Exam Examination of her left elbow shows significant tenderness over the lateral epicondylar area of the elbow.  The remainder of her elbow exam on the left side is entirely normal.  There is good range of motion and good stability elbow.  There is a negative Tinel's sign over the cubital tunnel.  Her shoulder seems to move well as is her hand.  There is no muscle atrophy. Specialty Comments:  No specialty comments available.  Imaging: XR Elbow Complete Left (3+View)  Result Date: 09/19/2020 3 views  of the left elbow show no acute findings.    PMFS History: There are no problems to display for this patient.  Past Medical History:  Diagnosis Date  . Allergy   . Anxiety     Family History  Problem Relation Age of Onset  . Breast cancer Paternal Grandmother   . Kidney cancer Maternal Aunt   . Kidney cancer Maternal Grandfather   . Colon polyps Neg Hx   . Colon cancer Neg Hx   . Esophageal cancer Neg Hx   . Rectal cancer Neg Hx   . Stomach cancer Neg Hx     Past Surgical History:  Procedure Laterality Date  . APPENDECTOMY    . CESAREAN SECTION     x1  . CHOLECYSTECTOMY    . FOOT SURGERY    . LASIK    . NOSE SURGERY    . 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   . TONSILLECTOMY    . TYMPANOSTOMY TUBE PLACEMENT     Social History   Occupational History  . Not on file  Tobacco Use  . Smoking status: Former  Research scientist (life sciences)  . Smokeless tobacco: Never Used  Substance and Sexual Activity  . Alcohol use: Yes    Comment: social- rare   . Drug use: Never  . Sexual activity: Not on file

## 2020-09-23 DIAGNOSIS — H33103 Unspecified retinoschisis, bilateral: Secondary | ICD-10-CM | POA: Diagnosis not present

## 2020-09-23 DIAGNOSIS — H524 Presbyopia: Secondary | ICD-10-CM | POA: Diagnosis not present

## 2020-10-03 ENCOUNTER — Ambulatory Visit: Payer: BC Managed Care – PPO | Admitting: Orthopaedic Surgery

## 2020-10-03 ENCOUNTER — Other Ambulatory Visit: Payer: Self-pay

## 2020-10-03 ENCOUNTER — Encounter: Payer: Self-pay | Admitting: Orthopaedic Surgery

## 2020-10-03 DIAGNOSIS — M25512 Pain in left shoulder: Secondary | ICD-10-CM

## 2020-10-03 MED ORDER — LIDOCAINE HCL 1 % IJ SOLN
1.0000 mL | INTRAMUSCULAR | Status: AC | PRN
  Administered 2020-10-03: 1 mL

## 2020-10-03 MED ORDER — METHYLPREDNISOLONE ACETATE 40 MG/ML IJ SUSP
40.0000 mg | INTRAMUSCULAR | Status: AC | PRN
  Administered 2020-10-03: 40 mg via INTRAMUSCULAR

## 2020-10-03 NOTE — Progress Notes (Signed)
   Office Visit Note   Patient: [name]           Date of Birth: 03/01/1971           MRN: [id number] Visit Date: 10/03/2020              Requested by: Mayra Neer, MD [address] [place],  [place] [address] PCP: Mayra Neer, MD   Assessment & Plan: Visit Diagnoses:  1. Trigger point of left shoulder region     Plan: See the note below about questions and concerns and follow-up.  Follow-Up Instructions: Return if symptoms worsen or fail to improve.   Orders:  Orders Placed This Encounter  Procedures   Trigger Point Inj   No orders of the defined types were placed in this encounter.     Procedures: Trigger Point Inj  Date/Time: 10/03/2020 4:42 PM Performed by: Mcarthur Rossetti, MD Authorized by: Mcarthur Rossetti, MD   Indications:  Pain Total # of Trigger Points:  1 Location: shoulder   Medications #1:  1 mL lidocaine 1 %; 40 mg methylPREDNISolone acetate 40 MG/ML  The patient came in today for follow-up after having a left elbow lateral epicondylar steroid injection.  She said that helped greatly and the elbow is feeling much better.  She then let me know that there is a lot of posterior scapular pain.  In that area of her left shoulder there is a trigger point that is quite painful.  The remainder of her shoulder exam is normal.  I did recommend a steroid injection as a trigger point injection around this area of maximum tenderness and she agreed to this and tolerated well.  Follow-up can be as needed.  She did let me know about some numbness that occurs in her right foot only when she is exercise walking.  She does have a history of some chronic low back pain but that does not sound that is radicular nature and what she is describing.  For now, I recommended that she go to [employer] till assess for appropriate shoe wear and that if things are worsening to let us know and I would probably get her in with Dr. Sharol Given for this  foot numbness.

## 2020-11-10 DIAGNOSIS — L578 Other skin changes due to chronic exposure to nonionizing radiation: Secondary | ICD-10-CM | POA: Diagnosis not present

## 2020-11-10 DIAGNOSIS — D2222 Melanocytic nevi of left ear and external auricular canal: Secondary | ICD-10-CM | POA: Diagnosis not present

## 2020-11-10 DIAGNOSIS — Z86018 Personal history of other benign neoplasm: Secondary | ICD-10-CM | POA: Diagnosis not present

## 2020-11-10 DIAGNOSIS — D485 Neoplasm of uncertain behavior of skin: Secondary | ICD-10-CM | POA: Diagnosis not present

## 2020-11-10 DIAGNOSIS — D225 Melanocytic nevi of trunk: Secondary | ICD-10-CM | POA: Diagnosis not present

## 2020-11-10 DIAGNOSIS — M79671 Pain in right foot: Secondary | ICD-10-CM | POA: Diagnosis not present

## 2020-11-16 ENCOUNTER — Telehealth: Payer: Self-pay | Admitting: Physical Medicine and Rehabilitation

## 2020-11-16 DIAGNOSIS — E669 Obesity, unspecified: Secondary | ICD-10-CM | POA: Insufficient documentation

## 2020-11-16 DIAGNOSIS — M545 Low back pain, unspecified: Secondary | ICD-10-CM | POA: Diagnosis not present

## 2020-11-16 DIAGNOSIS — G8929 Other chronic pain: Secondary | ICD-10-CM | POA: Diagnosis not present

## 2020-11-16 NOTE — Telephone Encounter (Signed)
Right L4 TF 07/14/20. Ok to repeat if helped, same problem/side, and no new injury?

## 2020-11-16 NOTE — Telephone Encounter (Signed)
Pt called wanting to get an appt for an epidural inj.   [phone]

## 2020-11-17 NOTE — Telephone Encounter (Signed)
Is auth needed? Scheduled for 8/17 with driver.

## 2020-11-21 ENCOUNTER — Ambulatory Visit: Payer: BC Managed Care – PPO | Admitting: Orthopaedic Surgery

## 2020-11-21 ENCOUNTER — Other Ambulatory Visit: Payer: Self-pay

## 2020-11-21 DIAGNOSIS — G8929 Other chronic pain: Secondary | ICD-10-CM

## 2020-11-21 DIAGNOSIS — M25512 Pain in left shoulder: Secondary | ICD-10-CM

## 2020-11-21 DIAGNOSIS — M7712 Lateral epicondylitis, left elbow: Secondary | ICD-10-CM | POA: Diagnosis not present

## 2020-11-21 NOTE — Progress Notes (Signed)
The patient comes in today with continued and worsening left shoulder and left elbow pain.  We have injected both her shoulder and her elbow.  She has had a history of a right lateral epicondylar release.  Her pain is been consistent with left shoulder rotator cuff tendinopathy and left elbow lateral epicondylitis.  She had injections in both of these areas in June.  She has tried activity modification as well as topical and oral anti-inflammatories.  The injections only helped for a few weeks.  She has tried both Celebrex and Aleve as well as Voltaren gel.  Examination of her left shoulder does show impingement and pain along the subacromial outlet with some weakness.  Examination left elbow shows severe pain over the lateral epicondyle.  At this point given the failed conservative treatment including activity modification, rest, stretching activities that have shown her how to do home exercise program, topical and oral anti-inflammatories and steroid injections, a MRI of the left shoulder is warranted to rule out a rotator cuff tear and a MRI of the left elbow is warranted to assess the ERCP tendon.  We will see her back after the studies.

## 2020-11-22 ENCOUNTER — Other Ambulatory Visit: Payer: Self-pay

## 2020-11-22 DIAGNOSIS — Z96612 Presence of left artificial shoulder joint: Secondary | ICD-10-CM

## 2020-11-22 DIAGNOSIS — M25522 Pain in left elbow: Secondary | ICD-10-CM

## 2020-11-30 ENCOUNTER — Ambulatory Visit: Payer: BC Managed Care – PPO | Admitting: Physical Medicine and Rehabilitation

## 2020-11-30 ENCOUNTER — Other Ambulatory Visit: Payer: Self-pay

## 2020-11-30 ENCOUNTER — Encounter: Payer: Self-pay | Admitting: Physical Medicine and Rehabilitation

## 2020-11-30 ENCOUNTER — Ambulatory Visit: Payer: Self-pay

## 2020-11-30 VITALS — BP 126/84 | HR 69

## 2020-11-30 DIAGNOSIS — M5416 Radiculopathy, lumbar region: Secondary | ICD-10-CM

## 2020-11-30 MED ORDER — METHYLPREDNISOLONE ACETATE 80 MG/ML IJ SUSP
80.0000 mg | Freq: Once | INTRAMUSCULAR | Status: AC
  Administered 2020-11-30: 80 mg

## 2020-11-30 NOTE — Progress Notes (Signed)
Pt state lower back pain mostly on her right side. Pt state any movement makes the pain worse. Pt state walking makes the pain worse. Pt state she takes pain meds and uses heating to help ease her pain. Pt has hx of inj on 07/14/20 pt state it helped it lasted a few weeks.  Numeric Pain Rating Scale and Functional Assessment Average Pain 4   In the last MONTH (on 0-10 scale) has pain interfered with the following?  1. General activity like being  able to carry out your everyday physical activities such as walking, climbing stairs, carrying groceries, or moving a chair?  Rating(10)   +Driver, -BT, -Dye Allergies.

## 2020-12-13 ENCOUNTER — Ambulatory Visit
Admission: RE | Admit: 2020-12-13 | Discharge: 2020-12-13 | Disposition: A | Discharge status: Home / Self Care | Payer: BC Managed Care – PPO | Source: Ambulatory Visit | Attending: Orthopaedic Surgery | Admitting: Orthopaedic Surgery

## 2020-12-13 ENCOUNTER — Other Ambulatory Visit: Payer: Self-pay

## 2020-12-13 DIAGNOSIS — S56512A Strain of other extensor muscle, fascia and tendon at forearm level, left arm, initial encounter: Secondary | ICD-10-CM | POA: Diagnosis not present

## 2020-12-13 DIAGNOSIS — M25522 Pain in left elbow: Secondary | ICD-10-CM

## 2020-12-13 DIAGNOSIS — Z96612 Presence of left artificial shoulder joint: Secondary | ICD-10-CM

## 2020-12-20 ENCOUNTER — Ambulatory Visit: Payer: BC Managed Care – PPO | Admitting: Orthopaedic Surgery

## 2020-12-20 ENCOUNTER — Encounter: Payer: Self-pay | Admitting: Orthopaedic Surgery

## 2020-12-20 ENCOUNTER — Other Ambulatory Visit: Payer: Self-pay

## 2020-12-20 DIAGNOSIS — G8929 Other chronic pain: Secondary | ICD-10-CM

## 2020-12-20 DIAGNOSIS — M25512 Pain in left shoulder: Secondary | ICD-10-CM | POA: Diagnosis not present

## 2020-12-20 DIAGNOSIS — M7712 Lateral epicondylitis, left elbow: Secondary | ICD-10-CM | POA: Diagnosis not present

## 2020-12-20 NOTE — Progress Notes (Signed)
[name] - 50 y.o. female MRN [id number]  Date of birth: 10-Aug-1970  Office Visit Note: Visit Date: 11/30/2020 PCP: Mayra Neer, MD Referred by: Mayra Neer, MD  Subjective: Chief Complaint  Patient presents with   Lower Back - Pain   HPI:  [name] is a 50 y.o. female who comes in today for planned repeat Right L4-L5  Lumbar Transforaminal epidural steroid injection with fluoroscopic guidance.  The patient has failed conservative care including home exercise, medications, time and activity modification.  This injection will be diagnostic and hopefully therapeutic.  Please see requesting physician notes for further details and justification. Patient received more than 50% pain relief from prior injection.   Referring: Dr. Jean Rosenthal   ROS Otherwise per HPI.  Assessment & Plan: Visit Diagnoses:    ICD-10-CM   1. Lumbar radiculopathy  M54.16 XR C-ARM NO REPORT    Epidural Steroid injection    methylPREDNISolone acetate (DEPO-MEDROL) injection 80 mg      Plan: No additional findings.   Meds & Orders:  Meds ordered this encounter  Medications   methylPREDNISolone acetate (DEPO-MEDROL) injection 80 mg    Orders Placed This Encounter  Procedures   XR C-ARM NO REPORT   Epidural Steroid injection    Follow-up: Return if symptoms worsen or fail to improve.   Procedures: No procedures performed  Lumbosacral Transforaminal Epidural Steroid Injection - Sub-Pedicular Approach with Fluoroscopic Guidance  Patient: [name]      Date of Birth: 1970/04/26 MRN: [id number] PCP: Mayra Neer, MD      Visit Date: 11/30/2020   Universal Protocol:    Date/Time: 11/30/2020  Consent Given By: the patient  Position: PRONE  Additional Comments: Vital signs were monitored before and after the procedure. Patient was prepped and draped in the usual sterile fashion. The correct patient, procedure, and site was  verified.   Injection Procedure Details:   Procedure diagnoses: Lumbar radiculopathy [M54.16]    Meds Administered:  Meds ordered this encounter  Medications   methylPREDNISolone acetate (DEPO-MEDROL) injection 80 mg    Laterality: Right  Location/Site:  L4-L5  Needle:5.0 in., 22 ga.  Short bevel or Quincke spinal needle  Needle Placement: Transforaminal  Findings:    -Comments: Excellent flow of contrast along the nerve, nerve root and into the epidural space.  Procedure Details: After squaring off the end-plates to get a true AP view, the C-arm was positioned so that an oblique view of the foramen as noted above was visualized. The target area is just inferior to the "nose of the scotty dog" or sub pedicular. The soft tissues overlying this structure were infiltrated with 2-3 ml. of 1% Lidocaine without Epinephrine.  The spinal needle was inserted toward the target using a "trajectory" view along the fluoroscope beam.  Under AP and lateral visualization, the needle was advanced so it did not puncture dura and was located close the 6 O'Clock position of the pedical in AP tracterory. Biplanar projections were used to confirm position. Aspiration was confirmed to be negative for CSF and/or blood. A 1-2 ml. volume of Isovue-250 was injected and flow of contrast was noted at each level. Radiographs were obtained for documentation purposes.   After attaining the desired flow of contrast documented above, a 0.5 to 1.0 ml test dose of 0.25% Marcaine was injected into each respective transforaminal space.  The patient was observed for 90 seconds post injection.  After no sensory deficits were reported, and normal lower extremity  motor function was noted,   the above injectate was administered so that equal amounts of the injectate were placed at each foramen (level) into the transforaminal epidural space.   Additional Comments:  The patient tolerated the procedure well Dressing: 2 x 2  sterile gauze and Band-Aid    Post-procedure details: Patient was observed during the procedure. Post-procedure instructions were reviewed.  Patient left the clinic in stable condition.    Clinical History: CLINICAL DATA:  Worsening right-sided low back pain and right leg weakness. Recent trauma.   EXAM: MRI LUMBAR SPINE WITHOUT CONTRAST   TECHNIQUE: Multiplanar, multisequence MR imaging of the lumbar spine was performed. No intravenous contrast was administered.   COMPARISON:  Radiography 08/11/2019   FINDINGS: Segmentation:  5 lumbar type vertebral bodies.   Alignment:  Normal   Vertebrae:  Normal   Conus medullaris and cauda equina: Conus extends to the L1 level. Conus and cauda equina appear normal.   Paraspinal and other soft tissues: Normal   Disc levels:   No abnormality at L3-4 or above.   L4-5: Mild disc degeneration with annular bulging slightly more prominent towards the right. Mild stenosis both lateral recesses. Some potential that either L5 nerve could be affected.   L5-S1: Normal interspace.   IMPRESSION: Single level abnormality at L4-5 with annular bulging. Mild stenosis of both lateral recesses that would have potential to affect either or both L5 nerves.     Electronically Signed   By: Nelson Chimes M.D.   On: 09/19/2019 13:39     Objective:  VS:  HT:    WT:   BMI:     BP:126/84  HR:69bpm  TEMP: ( )  RESP:  Physical Exam Vitals and nursing note reviewed.  Constitutional:      General: She is not in acute distress.    Appearance: Normal appearance. She is not ill-appearing.  HENT:     Head: Normocephalic and atraumatic.     Right Ear: External ear normal.     Left Ear: External ear normal.  Eyes:     Extraocular Movements: Extraocular movements intact.  Cardiovascular:     Rate and Rhythm: Normal rate.     Pulses: Normal pulses.  Pulmonary:     Effort: Pulmonary effort is normal. No respiratory distress.  Abdominal:      General: There is no distension.     Palpations: Abdomen is soft.  Musculoskeletal:        General: Tenderness present.     Cervical back: Neck supple.     Right lower leg: No edema.     Left lower leg: No edema.     Comments: Patient has good distal strength with no pain over the greater trochanters.  No clonus or focal weakness.  Skin:    Findings: No erythema, lesion or rash.  Neurological:     General: No focal deficit present.     Mental Status: She is alert and oriented to person, place, and time.     Sensory: No sensory deficit.     Motor: No weakness or abnormal muscle tone.     Coordination: Coordination normal.  Psychiatric:        Mood and Affect: Mood normal.        Behavior: Behavior normal.     Imaging: No results found.

## 2020-12-20 NOTE — Progress Notes (Signed)
The patient comes in today to go over MRI of her left shoulder and left elbow.  She is a chronic impingement with a left shoulder and problems with her left elbow in terms of the lateral epicondyle area.  This is been frustrating for her and has not gotten better since conservative treatment and time.  She tried anti-inflammatories and activity modification.  We have provided a steroid injection in the left subacromial outlet and around the left elbow lateral epicondylar area.  She has had a remote history of right elbow surgery by Dr. Percell Miller years ago for a similar issue.  The MRI of her left shoulder shows tendinosis of the rotator cuff that is mild.  There is no tearing.  There are some mild bursitis with the shoulder as well.  All the structures are intact.  The MRI of her left elbow shows tendinosis of the insertion of the extensor tendons.  There is some mild tearing that is not complete.  Her MRI findings and clinical exam findings are consistent with left shoulder impingement and left severe tennis elbow.  At this point I would like to send her to outpatient physical therapy for any modalities that therapist can try for the left shoulder and the left elbow to get these areas to hopefully improve in terms of pain management.  I will see her back in about 6 weeks.  If she continues to have issues with the elbow especially, I may send her to one of my partners to assess further for surgical intervention but only if needed.  All questions and concerns were answered and addressed.

## 2020-12-20 NOTE — Procedures (Signed)
Lumbosacral Transforaminal Epidural Steroid Injection - Sub-Pedicular Approach with Fluoroscopic Guidance  Patient: [name]      Date of Birth: May 14, 1970 MRN: [id number] PCP: Mayra Neer, MD      Visit Date: 11/30/2020   Universal Protocol:    Date/Time: 11/30/2020  Consent Given By: the patient  Position: PRONE  Additional Comments: Vital signs were monitored before and after the procedure. Patient was prepped and draped in the usual sterile fashion. The correct patient, procedure, and site was verified.   Injection Procedure Details:   Procedure diagnoses: Lumbar radiculopathy [M54.16]    Meds Administered:  Meds ordered this encounter  Medications   methylPREDNISolone acetate (DEPO-MEDROL) injection 80 mg    Laterality: Right  Location/Site:  L4-L5  Needle:5.0 in., 22 ga.  Short bevel or Quincke spinal needle  Needle Placement: Transforaminal  Findings:    -Comments: Excellent flow of contrast along the nerve, nerve root and into the epidural space.  Procedure Details: After squaring off the end-plates to get a true AP view, the C-arm was positioned so that an oblique view of the foramen as noted above was visualized. The target area is just inferior to the "nose of the scotty dog" or sub pedicular. The soft tissues overlying this structure were infiltrated with 2-3 ml. of 1% Lidocaine without Epinephrine.  The spinal needle was inserted toward the target using a "trajectory" view along the fluoroscope beam.  Under AP and lateral visualization, the needle was advanced so it did not puncture dura and was located close the 6 O'Clock position of the pedical in AP tracterory. Biplanar projections were used to confirm position. Aspiration was confirmed to be negative for CSF and/or blood. A 1-2 ml. volume of Isovue-250 was injected and flow of contrast was noted at each level. Radiographs were obtained for documentation purposes.   After attaining the  desired flow of contrast documented above, a 0.5 to 1.0 ml test dose of 0.25% Marcaine was injected into each respective transforaminal space.  The patient was observed for 90 seconds post injection.  After no sensory deficits were reported, and normal lower extremity motor function was noted,   the above injectate was administered so that equal amounts of the injectate were placed at each foramen (level) into the transforaminal epidural space.   Additional Comments:  The patient tolerated the procedure well Dressing: 2 x 2 sterile gauze and Band-Aid    Post-procedure details: Patient was observed during the procedure. Post-procedure instructions were reviewed.  Patient left the clinic in stable condition.

## 2020-12-21 ENCOUNTER — Other Ambulatory Visit: Payer: Self-pay

## 2020-12-21 DIAGNOSIS — M25512 Pain in left shoulder: Secondary | ICD-10-CM

## 2020-12-21 DIAGNOSIS — M7712 Lateral epicondylitis, left elbow: Secondary | ICD-10-CM

## 2020-12-21 DIAGNOSIS — G8929 Other chronic pain: Secondary | ICD-10-CM

## 2021-01-05 ENCOUNTER — Ambulatory Visit (INDEPENDENT_AMBULATORY_CARE_PROVIDER_SITE_OTHER): Payer: BC Managed Care – PPO | Admitting: Physical Therapy

## 2021-01-05 ENCOUNTER — Encounter: Payer: Self-pay | Admitting: Physical Therapy

## 2021-01-05 ENCOUNTER — Telehealth: Payer: Self-pay | Admitting: Physical Medicine and Rehabilitation

## 2021-01-05 ENCOUNTER — Other Ambulatory Visit: Payer: Self-pay

## 2021-01-05 DIAGNOSIS — M6281 Muscle weakness (generalized): Secondary | ICD-10-CM

## 2021-01-05 DIAGNOSIS — M5416 Radiculopathy, lumbar region: Secondary | ICD-10-CM

## 2021-01-05 DIAGNOSIS — R293 Abnormal posture: Secondary | ICD-10-CM

## 2021-01-05 DIAGNOSIS — M25522 Pain in left elbow: Secondary | ICD-10-CM

## 2021-01-05 DIAGNOSIS — M25512 Pain in left shoulder: Secondary | ICD-10-CM | POA: Diagnosis not present

## 2021-01-05 DIAGNOSIS — R29898 Other symptoms and signs involving the musculoskeletal system: Secondary | ICD-10-CM

## 2021-01-05 NOTE — Telephone Encounter (Signed)
Patient is here for PT. She would like Dr. Ernestina Patches to call her. Her number is [phone]

## 2021-01-05 NOTE — Telephone Encounter (Signed)
Right L4 TF on 11/30/20. 80% relief for a few weeks but pain returned last week. Please advise.

## 2021-01-05 NOTE — Patient Instructions (Signed)
Access Code: [email or web address] URL: [email or web address] Date: 01/05/2021 Prepared by: Faustino Congress  Exercises Standing Wrist Flexion Stretch - 2 x daily - 7 x weekly - 3 reps - 1 sets - 30 sec hold Standing Single Arm Shoulder Abduction with Dumbbell - Thumb Up - 2 x daily - 7 x weekly - 3 sets - 10 reps Shoulder External Rotation with Dumbbell - 2 x daily - 7 x weekly - 3 sets - 10 reps Seated Wrist Extension with Dumbbell - 2 x daily - 7 x weekly - 3 sets - 10 reps

## 2021-01-05 NOTE — Therapy (Signed)
[facility] [address] [place], [place], [address] Phone: [phone]   Fax:  [phone]  Physical Therapy Evaluation  Patient Details  Name: [name] MRN: [id number] Date of Birth: Mar 02, 1971 Referring Provider (PT): Mcarthur Rossetti, MD   Encounter Date: 01/05/2021   PT End of Session - 01/05/21 1428     Visit Number 1    Number of Visits 6    Date for PT Re-Evaluation 02/16/21    Authorization Type BCBS    PT Start Time 1430    PT Stop Time 1505    PT Time Calculation (min) 35 min    Activity Tolerance Patient tolerated treatment well    Behavior During Therapy [facility] for tasks assessed/performed             Past Medical History:  Diagnosis Date   Allergy    Anxiety     Past Surgical History:  Procedure Laterality Date   APPENDECTOMY     CESAREAN SECTION     x1   CHOLECYSTECTOMY     FOOT SURGERY     LASIK     NOSE SURGERY     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    Subjective Assessment - 01/05/21 1432     Subjective Pt is a 50 y/o female who presents to OPPT for Lt shoulder and elbow pain x several months.  She denies any specific injury but continues to have difficulty with holding and carrying objects.    Pertinent History chronic LBP, Rt elbow surgery    Limitations Lifting;Writing    Diagnostic tests MRI: RTC tendinosis    Patient Stated Goals improve pain    Currently in Pain? Yes    Pain Score 0-No pain   up to 10/10   Pain Location Shoulder   and elbow   Pain Orientation Left    Pain Descriptors / Indicators Sharp;Dull    Pain Type Chronic pain;Acute pain    Pain Onset More than a month ago    Pain Frequency Intermittent    Aggravating Factors  stretching, extending elbow    Pain Relieving Factors meds, rest                [facility] PT Assessment - 01/05/21 1426        Assessment   Medical Diagnosis M25.512,G89.29 (ICD-10-CM) - Chronic left shoulder pain  M77.12 (ICD-10-CM) - Lateral epicondylitis, left elbow    Referring Provider (PT) Mcarthur Rossetti, MD    Onset Date/Surgical Date --   several months ago   Hand Dominance Right    Next MD Visit 01/31/21    Prior Therapy none for this condition      Precautions   Precautions None      Restrictions   Weight Bearing Restrictions No      Balance Screen   Has the patient fallen in the past 6 months No    Has the patient had a decrease in activity level because of a fear of falling?  No    Is the patient reluctant to leave their home because of a fear of falling?  No      Home Social worker Private residence    Living Arrangements Spouse/significant other      Prior Function   Level of Independence  Independent    Vocation Full time employment    Vocation Requirements seated computer work    Leisure camping, walking, swimming, has stationary bike      Cognition   Overall Cognitive Status Within Functional Limits for tasks assessed      Observation/Other Assessments   Focus on Therapeutic Outcomes (FOTO)  63 (predicted 71)      Posture/Postural Control   Posture/Postural Control Postural limitations    Postural Limitations Rounded Shoulders;Forward head      AROM   Overall AROM Comments LUE WNL      Strength   Left Shoulder Flexion 5/5    Left Shoulder ABduction 3+/5   with pain   Left Shoulder Internal Rotation 5/5    Left Shoulder External Rotation 3+/5    Left Elbow Flexion 4/5   with pain   Left Elbow Extension 5/5    Left Forearm Pronation 5/5    Left Forearm Supination 4/5   with pain   Left Wrist Flexion 5/5    Left Wrist Extension --   with pain     Palpation   Palpation comment trigger points in Lt supraspinatus and infraspinatus as well as Lt wrist extensors                        Objective measurements completed on  examination: See above findings.       [facility] Adult PT Treatment/Exercise - 01/05/21 1506       Exercises   Exercises Other Exercises    Other Exercises  see self care - performed 1-2 reps of each exercise      Manual Therapy   Manual therapy comments STM with compression to Lt supra/infraspinatus and Lt wrist extensors              Trigger Point Dry Needling - 01/05/21 1506     Consent Given? Yes    Education Handout Provided Yes    Muscles Treated Upper Quadrant Supraspinatus;Infraspinatus    Muscles Treated Wrist/Hand Extensor carpi radialis longus/brevis    Supraspinatus Response Twitch response elicited    Infraspinatus Response Twitch response elicited    Extensor carpi radialis longus/brevis Response Twitch response elicited                   PT Education - 01/05/21 1428     Education Details HEP, DN    Person(s) Educated Patient    Methods Explanation;Demonstration;Handout    Comprehension Verbalized understanding;Returned demonstration;Need further instruction              PT Short Term Goals - 01/05/21 1427       PT SHORT TERM GOAL #1   Title independent with initial HEP    Time 3    Period Weeks    Status New    Target Date 01/26/21               PT Long Term Goals - 01/05/21 1427       PT LONG TERM GOAL #1   Title Pt will be I and compliant with HEP.    Time 6    Period Weeks    Status New    Target Date 02/16/21      PT LONG TERM GOAL #2   Title FOTO score improved to 71    Time 6    Period Weeks    Status New    Target Date 02/16/21      PT  LONG TERM GOAL #3   Title Pt will have overall less than 3/10 pain with usual activity    Time 6    Period Weeks    Status New    Target Date 02/16/21      PT LONG TERM GOAL #4   Title demonstrate 5/5 LUE strength without pain for improved function    Time 6    Period Weeks    Status New    Target Date 02/16/21                    Plan - 01/05/21 1510      Clinical Impression Statement Pt is a 50 y/o female who presents to OPPT with subacute Lt shoulder and elbow pain without known injury.  She demonstrates decreased strength, postural abnormalities, and active trigger points affecting functional mobility.  Pt will benefit from PT to address deficits listed.    Personal Factors and Comorbidities Comorbidity 2    Comorbidities LBP, Hx rt elbow surgery    Examination-Activity Limitations Reach Overhead;Lift;Dressing;Carry;Hygiene/Grooming    Examination-Participation Restrictions Cleaning;Meal Prep;Yard Work;Community Activity;Occupation;Driving    Stability/Clinical Decision Making Stable/Uncomplicated    Clinical Decision Making Low    Rehab Potential Good    PT Frequency 1x / week    PT Duration 6 weeks    PT Treatment/Interventions ADLs/Self Care Home Management;Cryotherapy;Electrical Stimulation;Ultrasound;Moist Heat;Iontophoresis 4mg /ml Dexamethasone;Therapeutic exercise;Therapeutic activities;Neuromuscular re-education;Patient/family education;Manual techniques;Passive range of motion;Dry needling;Taping;Vasopneumatic Device    PT Next Visit Plan review HEP, assess response to DN, continue with light strengthening/stretching    PT Home Exercise Plan Access Code: [email or web address]    Consulted and Agree with Plan of Care Patient             Patient will benefit from skilled therapeutic intervention in order to improve the following deficits and impairments:  Increased fascial restricitons, Increased muscle spasms, Pain, Postural dysfunction, Decreased strength  Visit Diagnosis: Acute pain of left shoulder - Plan: PT plan of care cert/re-cert  Pain in left elbow - Plan: PT plan of care cert/re-cert  Muscle weakness (generalized) - Plan: PT plan of care cert/re-cert  Abnormal posture - Plan: PT plan of care cert/re-cert  Other symptoms and signs involving the musculoskeletal system - Plan: PT plan of care cert/re-cert     Problem  List There are no problems to display for this patient.     Laureen Abrahams, PT, DPT 01/05/21 3:14 PM     [facility] [address] [place], [place], [address] Phone: [phone]   Fax:  [phone]  Name: Blaise Palladino MRN: [id number] Date of Birth: 01-20-1971

## 2021-01-09 NOTE — Telephone Encounter (Signed)
Referral placed. Patient has not been scheduled.

## 2021-01-11 ENCOUNTER — Telehealth: Payer: Self-pay | Admitting: Physical Medicine and Rehabilitation

## 2021-01-11 NOTE — Telephone Encounter (Signed)
Patient called. Returning a call to [place] 

## 2021-01-16 ENCOUNTER — Other Ambulatory Visit: Payer: Self-pay | Admitting: Orthopaedic Surgery

## 2021-01-19 ENCOUNTER — Other Ambulatory Visit: Payer: Self-pay

## 2021-01-19 ENCOUNTER — Encounter: Payer: Self-pay | Admitting: Physical Therapy

## 2021-01-19 ENCOUNTER — Ambulatory Visit (INDEPENDENT_AMBULATORY_CARE_PROVIDER_SITE_OTHER): Payer: BC Managed Care – PPO | Admitting: Physical Therapy

## 2021-01-19 DIAGNOSIS — R293 Abnormal posture: Secondary | ICD-10-CM

## 2021-01-19 DIAGNOSIS — M6281 Muscle weakness (generalized): Secondary | ICD-10-CM

## 2021-01-19 DIAGNOSIS — M25522 Pain in left elbow: Secondary | ICD-10-CM

## 2021-01-19 DIAGNOSIS — M25512 Pain in left shoulder: Secondary | ICD-10-CM

## 2021-01-19 DIAGNOSIS — R29898 Other symptoms and signs involving the musculoskeletal system: Secondary | ICD-10-CM

## 2021-01-19 NOTE — Therapy (Signed)
[facility] [address] [place], [place], [address] Phone: [phone]   Fax:  [phone]  Physical Therapy Treatment  Patient Details  Name: [name] MRN: [id number] Date of Birth: [birth year] Referring Provider (PT): Mcarthur Rossetti, MD   Encounter Date: 01/19/2021   PT End of Session - 01/19/21 1432     Visit Number 2    Number of Visits 6    Date for PT Re-Evaluation 02/16/21    Authorization Type BCBS    PT Start Time 1341    PT Stop Time 1427    PT Time Calculation (min) 46 min    Activity Tolerance Patient tolerated treatment well    Behavior During Therapy [facility] for tasks assessed/performed             Past Medical History:  Diagnosis Date   Allergy    Anxiety     Past Surgical History:  Procedure Laterality Date   APPENDECTOMY     CESAREAN SECTION     x1   CHOLECYSTECTOMY     FOOT SURGERY     LASIK     NOSE SURGERY     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   Subjective Assessment - 01/19/21 1342     Subjective overall doesn't notice any difference    Pertinent History chronic LBP, Rt elbow surgery    Limitations Lifting;Writing    Diagnostic tests MRI: RTC tendinosis    Patient Stated Goals improve pain    Currently in Pain? Yes    Pain Score 3     Pain Location Shoulder   into elbow   Pain Orientation Left    Pain Descriptors / Indicators Dull;Sharp    Pain Type Chronic pain;Acute pain    Pain Onset More than a month ago    Pain Frequency Intermittent    Aggravating Factors  stretching, extending elbow    Pain Relieving Factors meds, rest                               [facility] Adult PT Treatment/Exercise - 01/19/21 1344       Exercises   Exercises Shoulder;Elbow      Elbow Exercises   Elbow Extension 20 reps;Left;Seated;Bar weights/barbell    Bar  Weights/Barbell (Elbow Extension) 2 lbs    Other elbow exercises wrist extensors stretch 3x30 sec      Shoulder Exercises: Seated   External Rotation Left;20 reps;Weights    External Rotation Weight (lbs) 2      Shoulder Exercises: Standing   ABduction Left;20 reps;Weights    Shoulder ABduction Weight (lbs) 2      Shoulder Exercises: ROM/Strengthening   UBE (Upper Arm Bike) L1.5 x 4 min (2' each direction)      Shoulder Exercises: Stretch   Corner Stretch Limitations mid doorway stretch 3x30 sec      Modalities   Modalities Iontophoresis      Iontophoresis   Type of Iontophoresis Dexamethasone    Location Lt post shoulder and Lt elbow    Dose 1.0 cc x 2    Time 4 hour patch      Manual Therapy   Manual therapy comments use of percussive device to wrist extensors and posterior shoulder  PT Short Term Goals - 01/19/21 1433       PT SHORT TERM GOAL #1   Title independent with initial HEP    Time 3    Period Weeks    Status Achieved    Target Date 01/26/21               PT Long Term Goals - 01/05/21 1427       PT LONG TERM GOAL #1   Title Pt will be I and compliant with HEP.    Time 6    Period Weeks    Status New    Target Date 02/16/21      PT LONG TERM GOAL #2   Title FOTO score improved to 71    Time 6    Period Weeks    Status New    Target Date 02/16/21      PT LONG TERM GOAL #3   Title Pt will have overall less than 3/10 pain with usual activity    Time 6    Period Weeks    Status New    Target Date 02/16/21      PT LONG TERM GOAL #4   Title demonstrate 5/5 LUE strength without pain for improved function    Time 6    Period Weeks    Status New    Target Date 02/16/21                   Plan - 01/19/21 1433     Clinical Impression Statement Pt reports no improvement following DN last week and is compliant with HEP at this time meeting STG #1.  Trial of ionto to Lt shoulder and elbow today to  see if this will help.  Will continue to benefit from PT to maximize function    Personal Factors and Comorbidities Comorbidity 2    Comorbidities LBP, Hx rt elbow surgery    Examination-Activity Limitations Reach Overhead;Lift;Dressing;Carry;Hygiene/Grooming    Examination-Participation Restrictions Cleaning;Meal Prep;Yard Work;Community Activity;Occupation;Driving    Stability/Clinical Decision Making Stable/Uncomplicated    Rehab Potential Good    PT Frequency 1x / week    PT Duration 6 weeks    PT Treatment/Interventions ADLs/Self Care Home Management;Cryotherapy;Electrical Stimulation;Ultrasound;Moist Heat;Iontophoresis 4mg /ml Dexamethasone;Therapeutic exercise;Therapeutic activities;Neuromuscular re-education;Patient/family education;Manual techniques;Passive range of motion;Dry needling;Taping;Vasopneumatic Device    PT Next Visit Plan assess response to ionto, continue with ionto #2, continue with light strengthening/stretching    PT Home Exercise Plan Access Code: [email or web address]    Consulted and Agree with Plan of Care Patient             Patient will benefit from skilled therapeutic intervention in order to improve the following deficits and impairments:  Increased fascial restricitons, Increased muscle spasms, Pain, Postural dysfunction, Decreased strength  Visit Diagnosis: Acute pain of left shoulder  Pain in left elbow  Muscle weakness (generalized)  Abnormal posture  Other symptoms and signs involving the musculoskeletal system     Problem List There are no problems to display for this patient.     Laureen Abrahams, PT, DPT 01/19/21 2:39 PM     [facility] [address] [place], [place], [address] Phone: [phone]   Fax:  [phone]  Name: [name] MRN: [id number] Date of Birth: 1971-04-04

## 2021-01-23 ENCOUNTER — Other Ambulatory Visit: Payer: Self-pay

## 2021-01-23 ENCOUNTER — Ambulatory Visit (INDEPENDENT_AMBULATORY_CARE_PROVIDER_SITE_OTHER): Payer: BC Managed Care – PPO | Admitting: Physical Therapy

## 2021-01-23 ENCOUNTER — Encounter: Payer: Self-pay | Admitting: Physical Therapy

## 2021-01-23 DIAGNOSIS — M25522 Pain in left elbow: Secondary | ICD-10-CM | POA: Diagnosis not present

## 2021-01-23 DIAGNOSIS — R293 Abnormal posture: Secondary | ICD-10-CM | POA: Diagnosis not present

## 2021-01-23 DIAGNOSIS — M6281 Muscle weakness (generalized): Secondary | ICD-10-CM | POA: Diagnosis not present

## 2021-01-23 DIAGNOSIS — M25512 Pain in left shoulder: Secondary | ICD-10-CM

## 2021-01-23 DIAGNOSIS — R29898 Other symptoms and signs involving the musculoskeletal system: Secondary | ICD-10-CM

## 2021-01-23 NOTE — Therapy (Signed)
[facility] [address] [place], [place], [address] Phone: [phone]   Fax:  [phone]  Physical Therapy Treatment  Patient Details  Name: [name] MRN: [id number] Date of Birth: [birth year] Referring Provider (PT): Mcarthur Rossetti, MD   Encounter Date: 01/23/2021   PT End of Session - 01/23/21 1518     Visit Number 3    Number of Visits 6    Date for PT Re-Evaluation 02/16/21    Authorization Type BCBS    PT Start Time 1430    PT Stop Time 1512    PT Time Calculation (min) 42 min    Activity Tolerance Patient tolerated treatment well    Behavior During Therapy [facility] for tasks assessed/performed             Past Medical History:  Diagnosis Date   Allergy    Anxiety     Past Surgical History:  Procedure Laterality Date   APPENDECTOMY     CESAREAN SECTION     x1   CHOLECYSTECTOMY     FOOT SURGERY     LASIK     NOSE SURGERY     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   Subjective Assessment - 01/23/21 1433     Subjective was really hurting this morning, but symptoms seem to be better now.  symptoms overall about the same.    Pertinent History chronic LBP, Rt elbow surgery    Limitations Lifting;Writing    Diagnostic tests MRI: RTC tendinosis    Patient Stated Goals improve pain    Currently in Pain? Yes    Pain Score 2     Pain Location Shoulder    Pain Orientation Left    Pain Descriptors / Indicators Sharp;Dull    Pain Type Chronic pain;Acute pain    Pain Onset More than a month ago    Pain Frequency Intermittent    Aggravating Factors  stretching, extending elbow    Pain Relieving Factors meds, rest                               [facility] Adult PT Treatment/Exercise - 01/23/21 1434       Elbow Exercises   Elbow Flexion Limitations increased pain so held     Elbow Extension 20 reps;Left;Seated;Bar weights/barbell    Bar Weights/Barbell (Elbow Extension) 2 lbs    Forearm Supination 20 reps;Left;Seated;Bar weights/barbell    Bar Weights/Barbell (Forearm Supination) 2 lbs    Forearm Pronation 20 reps;Left;Seated;Bar weights/barbell    Bar Weights/Barbell (Forearm Pronation) 2 lbs    Other elbow exercises wrist extension on Lt x20 reps; 2#      Shoulder Exercises: Standing   External Rotation Left;20 reps;Theraband    Theraband Level (Shoulder External Rotation) Level 2 (Red)    Row 20 reps;Theraband    Theraband Level (Shoulder Row) Level 2 (Red)      Shoulder Exercises: ROM/Strengthening   UBE (Upper Arm Bike) L1 x 6 min (3' each direction)      Iontophoresis   Type of Iontophoresis Dexamethasone    Location Lt post shoulder and Lt elbow    Dose 1.0 cc x 2    Time 4 hour patch  PT Education - 01/23/21 1517     Education Details added to HEP    Person(s) Educated Patient    Methods Explanation;Demonstration;Handout    Comprehension Verbalized understanding;Returned demonstration;Need further instruction              PT Short Term Goals - 01/19/21 1433       PT SHORT TERM GOAL #1   Title independent with initial HEP    Time 3    Period Weeks    Status Achieved    Target Date 01/26/21               PT Long Term Goals - 01/05/21 1427       PT LONG TERM GOAL #1   Title Pt will be I and compliant with HEP.    Time 6    Period Weeks    Status New    Target Date 02/16/21      PT LONG TERM GOAL #2   Title FOTO score improved to 71    Time 6    Period Weeks    Status New    Target Date 02/16/21      PT LONG TERM GOAL #3   Title Pt will have overall less than 3/10 pain with usual activity    Time 6    Period Weeks    Status New    Target Date 02/16/21      PT LONG TERM GOAL #4   Title demonstrate 5/5 LUE strength without pain for improved function    Time 6    Period Weeks     Status New    Target Date 02/16/21                   Plan - 01/23/21 1518     Clinical Impression Statement Pt tolerated session well today adding additional light strengthening exercises into HEP and repeating ionto to see if this will provide benefit.  Will continue to benefit from PT to maximize function.    Personal Factors and Comorbidities Comorbidity 2    Comorbidities LBP, Hx rt elbow surgery    Examination-Activity Limitations Reach Overhead;Lift;Dressing;Carry;Hygiene/Grooming    Examination-Participation Restrictions Cleaning;Meal Prep;Yard Work;Community Activity;Occupation;Driving    Stability/Clinical Decision Making Stable/Uncomplicated    Rehab Potential Good    PT Frequency 1x / week    PT Duration 6 weeks    PT Treatment/Interventions ADLs/Self Care Home Management;Cryotherapy;Electrical Stimulation;Ultrasound;Moist Heat;Iontophoresis 4mg /ml Dexamethasone;Therapeutic exercise;Therapeutic activities;Neuromuscular re-education;Patient/family education;Manual techniques;Passive range of motion;Dry needling;Taping;Vasopneumatic Device    PT Next Visit Plan assess response to ionto, continue with ionto #3, continue with light strengthening/stretching and review HEP PRN    PT Home Exercise Plan Access Code: [email or web address]    Consulted and Agree with Plan of Care Patient             Patient will benefit from skilled therapeutic intervention in order to improve the following deficits and impairments:  Increased fascial restricitons, Increased muscle spasms, Pain, Postural dysfunction, Decreased strength  Visit Diagnosis: Acute pain of left shoulder  Pain in left elbow  Muscle weakness (generalized)  Abnormal posture  Other symptoms and signs involving the musculoskeletal system     Problem List There are no problems to display for this patient.    Laureen Abrahams, PT, DPT 01/23/21 3:20 PM      [facility] [address] [place], [place], [address] Phone: [phone]   Fax:  [phone]  Name: Nana Vastine MRN:  [id number] Date of Birth: May 12, 1970

## 2021-01-23 NOTE — Patient Instructions (Signed)
Access Code: [email or web address] URL: [email or web address] Date: 01/23/2021 Prepared by: Faustino Congress  Exercises Standing Wrist Flexion Stretch - 2 x daily - 7 x weekly - 3 reps - 1 sets - 30 sec hold Standing Single Arm Shoulder Abduction with Dumbbell - Thumb Up - 2 x daily - 7 x weekly - 3 sets - 10 reps Shoulder External Rotation with Dumbbell - 2 x daily - 7 x weekly - 3 sets - 10 reps Seated Wrist Extension with Dumbbell - 2 x daily - 7 x weekly - 3 sets - 10 reps Seated Wrist Flexion with Dumbbell - 2 x daily - 7 x weekly - 3 sets - 10 reps Standing Row with Anchored Resistance - 1 x daily - 7 x weekly - 3 sets - 10 reps Standing Shoulder Internal Rotation with Anchored Resistance - 1 x daily - 7 x weekly - 3 sets - 10 reps Shoulder External Rotation with Anchored Resistance - 1 x daily - 7 x weekly - 3 sets - 10 reps Seated Pronation Supination with Dumbbell - 1 x daily - 7 x weekly - 3 sets - 10 reps

## 2021-01-25 ENCOUNTER — Ambulatory Visit: Payer: BC Managed Care – PPO | Admitting: Physical Medicine and Rehabilitation

## 2021-01-25 ENCOUNTER — Ambulatory Visit: Payer: Self-pay

## 2021-01-25 ENCOUNTER — Other Ambulatory Visit: Payer: Self-pay

## 2021-01-25 ENCOUNTER — Encounter: Payer: Self-pay | Admitting: Physical Medicine and Rehabilitation

## 2021-01-25 VITALS — BP 122/84 | HR 89

## 2021-01-25 DIAGNOSIS — M5416 Radiculopathy, lumbar region: Secondary | ICD-10-CM | POA: Diagnosis not present

## 2021-01-25 MED ORDER — METHYLPREDNISOLONE ACETATE 80 MG/ML IJ SUSP
80.0000 mg | Freq: Once | INTRAMUSCULAR | Status: AC
Start: 2021-01-25 — End: 2021-01-25
  Administered 2021-01-25: 80 mg

## 2021-01-25 NOTE — Progress Notes (Signed)
Pt state lower back pain that travels to her buttock. Pt state walking and standing makes the pain worse. Pt state she takes pain meds to help ease her pain. Pt has hx of inj on 11/30/20 pt state it helped for a few weeks.  Numeric Pain Rating Scale and Functional Assessment Average Pain 3   In the last MONTH (on 0-10 scale) has pain interfered with the following?  1. General activity like being  able to carry out your everyday physical activities such as walking, climbing stairs, carrying groceries, or moving a chair?  Rating(9)   +Driver, -BT, -Dye Allergies.

## 2021-01-25 NOTE — Patient Instructions (Signed)

## 2021-01-31 ENCOUNTER — Ambulatory Visit: Payer: BC Managed Care – PPO | Admitting: Orthopaedic Surgery

## 2021-01-31 ENCOUNTER — Encounter: Payer: Self-pay | Admitting: Physical Therapy

## 2021-01-31 ENCOUNTER — Other Ambulatory Visit: Payer: Self-pay

## 2021-01-31 ENCOUNTER — Ambulatory Visit (INDEPENDENT_AMBULATORY_CARE_PROVIDER_SITE_OTHER): Payer: BC Managed Care – PPO | Admitting: Physical Therapy

## 2021-01-31 DIAGNOSIS — R293 Abnormal posture: Secondary | ICD-10-CM | POA: Diagnosis not present

## 2021-01-31 DIAGNOSIS — R29898 Other symptoms and signs involving the musculoskeletal system: Secondary | ICD-10-CM

## 2021-01-31 DIAGNOSIS — M6281 Muscle weakness (generalized): Secondary | ICD-10-CM

## 2021-01-31 DIAGNOSIS — M545 Low back pain, unspecified: Secondary | ICD-10-CM | POA: Diagnosis not present

## 2021-01-31 DIAGNOSIS — M25512 Pain in left shoulder: Secondary | ICD-10-CM | POA: Diagnosis not present

## 2021-01-31 DIAGNOSIS — G8929 Other chronic pain: Secondary | ICD-10-CM | POA: Diagnosis not present

## 2021-01-31 DIAGNOSIS — M25522 Pain in left elbow: Secondary | ICD-10-CM

## 2021-01-31 DIAGNOSIS — M7712 Lateral epicondylitis, left elbow: Secondary | ICD-10-CM | POA: Diagnosis not present

## 2021-01-31 DIAGNOSIS — M7542 Impingement syndrome of left shoulder: Secondary | ICD-10-CM

## 2021-01-31 NOTE — Therapy (Signed)
[facility] Physical Therapy [address] [place], [place], [address] Phone: [phone]   Fax:  [phone]  Physical Therapy Treatment  Patient Details  Name: [name] MRN: [id number] Date of Birth: 09-06-1970 Referring Provider (PT): Mcarthur Rossetti, MD   Encounter Date: 01/31/2021   PT End of Session - 01/31/21 0924     Visit Number 4    Number of Visits 6    Date for PT Re-Evaluation 02/16/21    Authorization Type BCBS    PT Start Time 0844    PT Stop Time 0923    PT Time Calculation (min) 39 min    Activity Tolerance Patient tolerated treatment well    Behavior During Therapy [facility] for tasks assessed/performed             Past Medical History:  Diagnosis Date   Allergy    Anxiety     Past Surgical History:  Procedure Laterality Date   APPENDECTOMY     CESAREAN SECTION     x1   CHOLECYSTECTOMY     FOOT SURGERY     LASIK     NOSE SURGERY     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   Subjective Assessment - 01/31/21 0845     Subjective "it's feeling better." thinks ionto is helping    Pertinent History chronic LBP, Rt elbow surgery    Limitations Lifting;Writing    Diagnostic tests MRI: RTC tendinosis    Patient Stated Goals improve pain    Currently in Pain? No/denies    Pain Onset --                               [facility] Adult PT Treatment/Exercise - 01/31/21 0847       Elbow Exercises   Forearm Supination 20 reps;Left;Seated;Bar weights/barbell    Bar Weights/Barbell (Forearm Supination) 2 lbs    Forearm Pronation 20 reps;Left;Seated;Bar weights/barbell    Bar Weights/Barbell (Forearm Pronation) 2 lbs    Other elbow exercises wrist extension and flexion on Lt x20 reps; 2#      Shoulder Exercises: Standing   External Rotation Left;20 reps;Theraband    Theraband Level  (Shoulder External Rotation) Level 3 (Green)    Internal Rotation Left;20 reps;Theraband    Theraband Level (Shoulder Internal Rotation) Level 3 (Green)    Flexion Both;20 reps;Weights    Shoulder Flexion Weight (lbs) 2    ABduction Left;20 reps;Weights    Shoulder ABduction Weight (lbs) 2    Row 20 reps;Theraband    Theraband Level (Shoulder Row) Level 3 (Green)    Other Standing Exercises bicep curl with overhead press 2# 2x10 bil      Shoulder Exercises: ROM/Strengthening   UBE (Upper Arm Bike) L3 x 6 min (3' each direction)      Iontophoresis   Type of Iontophoresis Dexamethasone    Location Lt post shoulder and Lt elbow    Dose 1.0 cc x 2    Time 4 hour patch                       PT Short Term Goals - 01/19/21 1433       PT SHORT TERM GOAL #1   Title independent with initial HEP  Time 3    Period Weeks    Status Achieved    Target Date 01/26/21               PT Long Term Goals - 01/31/21 0924       PT LONG TERM GOAL #1   Title Pt will be I and compliant with HEP.    Time 6    Period Weeks    Status On-going    Target Date 02/16/21      PT LONG TERM GOAL #2   Title FOTO score improved to 71    Time 6    Period Weeks    Status On-going      PT LONG TERM GOAL #3   Title Pt will have overall less than 3/10 pain with usual activity    Baseline 10/18: met to date, will plan to formally assess carryover next visit    Time 6    Period Weeks    Status On-going      PT LONG TERM GOAL #4   Title demonstrate 5/5 LUE strength without pain for improved function    Time 6    Period Weeks    Status On-going                   Plan - 01/31/21 0925     Clinical Impression Statement Pt overall reporting decreased pain and denies pain today demonstrating progress towards LTGs.  If pain continues to be minimal or no longer present may be ready to d/c next visit.  Will plan to reassess next visit.    Personal Factors and Comorbidities  Comorbidity 2    Comorbidities LBP, Hx rt elbow surgery    Examination-Activity Limitations Reach Overhead;Lift;Dressing;Carry;Hygiene/Grooming    Examination-Participation Restrictions Cleaning;Meal Prep;Yard Work;Community Activity;Occupation;Driving    Stability/Clinical Decision Making Stable/Uncomplicated    Rehab Potential Good    PT Frequency 1x / week    PT Duration 6 weeks    PT Treatment/Interventions ADLs/Self Care Home Management;Cryotherapy;Electrical Stimulation;Ultrasound;Moist Heat;Iontophoresis 80m/ml Dexamethasone;Therapeutic exercise;Therapeutic activities;Neuromuscular re-education;Patient/family education;Manual techniques;Passive range of motion;Dry needling;Taping;Vasopneumatic Device    PT Next Visit Plan assess response to ionto, continue with ionto #4, continue with light strengthening/stretching and review HEP PRN; check goals/possible d/c?    PT Home Exercise Plan Access Code: [email or web address]    Consulted and Agree with Plan of Care Patient             Patient will benefit from skilled therapeutic intervention in order to improve the following deficits and impairments:  Increased fascial restricitons, Increased muscle spasms, Pain, Postural dysfunction, Decreased strength  Visit Diagnosis: Acute pain of left shoulder  Pain in left elbow  Muscle weakness (generalized)  Abnormal posture  Other symptoms and signs involving the musculoskeletal system     Problem List There are no problems to display for this patient.     SLaureen Abrahams PT, DPT 01/31/21 9:27 AM     [facility] [address] [address]Phone: [phone]  Fax:  [phone] Name: Brianna WilmouthMRN: [id number] of Birth: [birth year]

## 2021-01-31 NOTE — Progress Notes (Signed)
HPI: [name] returns today for follow-up of her low back pain.  She states unfortunately that the epidural steroid injection given by Dr. Ernestina Patches did not help.  She was given a right L4-5 transforaminal injection.  She states she still having 8-10 out of 10 back pain right lower paraspinous region.  No radicular symptoms.  No rating pain.  She does have a home exercise program.  She notes that stretching helps.  She also takes Celebrex sporadically and tramadol for the pain.  In regards to her left shoulder she feels physical therapy is helping.  She has a twinge of pain every once in a while but there is no significant pain in the shoulder at this point time and no loss of range of motion.  Left elbow feels therapy helps particularly some of the modalities however dry needling did not help at all.  Again she has left elbow lateral epicondylitis.  Review of systems: See HPI otherwise negative  Physical exam: General well-developed well-nourished female no acute distress mood affect appropriate  Shoulder: 5/5 strength with external and internal rotation against resistance empty can test is negative bilaterally.  Impingement testing is negative she has full overhead activity with the left shoulder.  Left elbow: Tenderness over the lateral epicondyle region.  Provocative maneuvers cause Pain.  She has good range of motion of the left elbow otherwise.  Impression: Low back pain without radicular symptoms Left shoulder impingement Left elbow lateral epicondylitis  Plan: She will continue physical therapy for her shoulder and elbow.  Talked to her about possible deep tissue myofascial release with a massage therapist.  Discussed lifting techniques in regards to her left elbow.  In regards to her back, she wants to wait till she gets back from vacation to see if the back is still bothering her at that point in time she may benefit from a right L5 injection with Dr. Ernestina Patches.  Questions encouraged and  answered at length by Dr. Ninfa Linden myself.  Follow-up as needed.

## 2021-02-01 DIAGNOSIS — Z0000 Encounter for general adult medical examination without abnormal findings: Secondary | ICD-10-CM | POA: Diagnosis not present

## 2021-02-01 DIAGNOSIS — Z79899 Other long term (current) drug therapy: Secondary | ICD-10-CM | POA: Diagnosis not present

## 2021-02-01 DIAGNOSIS — G43909 Migraine, unspecified, not intractable, without status migrainosus: Secondary | ICD-10-CM | POA: Diagnosis not present

## 2021-02-01 DIAGNOSIS — Z13220 Encounter for screening for lipoid disorders: Secondary | ICD-10-CM | POA: Diagnosis not present

## 2021-02-01 DIAGNOSIS — N951 Menopausal and female climacteric states: Secondary | ICD-10-CM | POA: Diagnosis not present

## 2021-02-01 DIAGNOSIS — Z23 Encounter for immunization: Secondary | ICD-10-CM | POA: Diagnosis not present

## 2021-02-06 ENCOUNTER — Encounter: Payer: Self-pay | Admitting: Physical Therapy

## 2021-02-06 ENCOUNTER — Ambulatory Visit (INDEPENDENT_AMBULATORY_CARE_PROVIDER_SITE_OTHER): Payer: BC Managed Care – PPO | Admitting: Physical Therapy

## 2021-02-06 ENCOUNTER — Other Ambulatory Visit: Payer: Self-pay

## 2021-02-06 DIAGNOSIS — M6281 Muscle weakness (generalized): Secondary | ICD-10-CM | POA: Diagnosis not present

## 2021-02-06 DIAGNOSIS — R293 Abnormal posture: Secondary | ICD-10-CM | POA: Diagnosis not present

## 2021-02-06 DIAGNOSIS — M25512 Pain in left shoulder: Secondary | ICD-10-CM

## 2021-02-06 DIAGNOSIS — M25522 Pain in left elbow: Secondary | ICD-10-CM | POA: Diagnosis not present

## 2021-02-06 NOTE — Therapy (Addendum)
[facility] Physical Therapy [address] [place], [place], [address] Phone: [phone]   Fax:  [phone]  Physical Therapy Treatment /Discharge   Patient Details  Name: [name] MRN: [id number] Date of Birth: 01-May-1970 Referring Provider (PT): Mcarthur Rossetti, MD   Encounter Date: 02/06/2021   PT End of Session - 02/06/21 1536     Visit Number 5    Number of Visits 6    Date for PT Re-Evaluation 02/16/21    Authorization Type BCBS    PT Start Time 1508    PT Stop Time 1535    PT Time Calculation (min) 27 min    Activity Tolerance Patient tolerated treatment well    Behavior During Therapy [facility] for tasks assessed/performed             Past Medical History:  Diagnosis Date   Allergy    Anxiety     Past Surgical History:  Procedure Laterality Date   APPENDECTOMY     CESAREAN SECTION     x1   CHOLECYSTECTOMY     FOOT SURGERY     LASIK     NOSE SURGERY     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   Subjective Assessment - 02/06/21 1510     Subjective isn't having much pain which has been consistent over the past couple of weeks    Pertinent History chronic LBP, Rt elbow surgery    Limitations Lifting;Writing    Diagnostic tests MRI: RTC tendinosis    Patient Stated Goals improve pain    Currently in Pain? No/denies                [facility] PT Assessment - 02/06/21 1520       Assessment   Medical Diagnosis M25.512,G89.29 (ICD-10-CM) - Chronic left shoulder pain  M77.12 (ICD-10-CM) - Lateral epicondylitis, left elbow    Referring Provider (PT) Mcarthur Rossetti, MD      Observation/Other Assessments   Focus on Therapeutic Outcomes (FOTO)  69      Strength   Left Shoulder Flexion 5/5    Left Shoulder ABduction 5/5    Left Shoulder Internal Rotation 5/5    Left Shoulder External Rotation 4+/5     Left Elbow Flexion 5/5    Left Forearm Pronation 5/5    Left Forearm Supination 5/5    Left Wrist Flexion 5/5    Left Wrist Extension 5/5                           [facility] Adult PT Treatment/Exercise - 02/06/21 1511       Elbow Exercises   Forearm Supination 20 reps;Left;Seated;Bar weights/barbell    Bar Weights/Barbell (Forearm Supination) 2 lbs    Forearm Pronation 20 reps;Left;Seated;Bar weights/barbell    Bar Weights/Barbell (Forearm Pronation) 2 lbs    Other elbow exercises wrist extension and flexion on Lt x20 reps; 2#      Shoulder Exercises: ROM/Strengthening   UBE (Upper Arm Bike) L4 x 6 min (3' each direction)                       PT Short Term Goals - 01/19/21 1433       PT SHORT TERM GOAL #1   Title independent  with initial HEP    Time 3    Period Weeks    Status Achieved    Target Date 01/26/21               PT Long Term Goals - 02/06/21 1536       PT LONG TERM GOAL #1   Title Pt will be I and compliant with HEP.    Time 6    Period Weeks    Status Achieved      PT LONG TERM GOAL #2   Title FOTO score improved to 71    Baseline 10/24: improved to 69    Time 6    Period Weeks    Status Not Met      PT LONG TERM GOAL #3   Title Pt will have overall less than 3/10 pain with usual activity    Baseline 10/18: met to date, will plan to formally assess carryover next visit    Time 6    Period Weeks    Status Achieved      PT LONG TERM GOAL #4   Title demonstrate 5/5 LUE strength without pain for improved function    Time 6    Period Weeks    Status Achieved                   Plan - 02/06/21 1537     Clinical Impression Statement Pt has met all goals except FOTO score at this time.  She's demonstrated great progress and is near FOTO goal, just not to predicted number.  Plan to hold PT and will d/c if she doesn't return.    Personal Factors and Comorbidities Comorbidity 2    Comorbidities LBP, Hx rt  elbow surgery    Examination-Activity Limitations Reach Overhead;Lift;Dressing;Carry;Hygiene/Grooming    Examination-Participation Restrictions Cleaning;Meal Prep;Yard Work;Community Activity;Occupation;Driving    Stability/Clinical Decision Making Stable/Uncomplicated    Rehab Potential Good    PT Frequency 1x / week    PT Duration 6 weeks    PT Treatment/Interventions ADLs/Self Care Home Management;Cryotherapy;Electrical Stimulation;Ultrasound;Moist Heat;Iontophoresis 7m/ml Dexamethasone;Therapeutic exercise;Therapeutic activities;Neuromuscular re-education;Patient/family education;Manual techniques;Passive range of motion;Dry needling;Taping;Vasopneumatic Device    PT Next Visit Plan hold x 30 days; d/c if pt doesn't return, if pt returns then reassess    PT Home Exercise Plan Access Code: [email or web address]    Consulted and Agree with Plan of Care Patient             Patient will benefit from skilled therapeutic intervention in order to improve the following deficits and impairments:  Increased fascial restricitons, Increased muscle spasms, Pain, Postural dysfunction, Decreased strength  Visit Diagnosis: Acute pain of left shoulder  Pain in left elbow  Muscle weakness (generalized)  Abnormal posture     Problem List There are no problems to display for this patient.     SLaureen Klein PT, DPT 02/06/21 3:40 PM  PHYSICAL THERAPY DISCHARGE SUMMARY  Visits from Start of Care: 5  Current functional level related to goals / functional outcomes: See note   Remaining deficits: See note   Education / Equipment: HEP   Patient agrees to discharge. Patient goals were partially met. Patient is being discharged due to not returning since the last visit. MScot Jun PT, DPT, OCS, ATC 03/28/21  10:08 AM      [facility] Therapy [address] [address]Phone: [phone]  Fax:  [phone] Name: Brianna WortheyMRN: [id number] of  Birth: 1971/02/18

## 2021-02-09 NOTE — Procedures (Signed)
Lumbosacral Transforaminal Epidural Steroid Injection - Sub-Pedicular Approach with Fluoroscopic Guidance  Patient: [name]      Date of Birth: Feb 23, 1971 MRN: [id number] PCP: Mayra Neer, MD      Visit Date: 01/25/2021   Universal Protocol:    Date/Time: 01/25/2021  Consent Given By: the patient  Position: PRONE  Additional Comments: Vital signs were monitored before and after the procedure. Patient was prepped and draped in the usual sterile fashion. The correct patient, procedure, and site was verified.   Injection Procedure Details:   Procedure diagnoses: Lumbar radiculopathy [M54.16]    Meds Administered:  Meds ordered this encounter  Medications   methylPREDNISolone acetate (DEPO-MEDROL) injection 80 mg    Laterality: Right  Location/Site: L4  Needle:5.0 in., 22 ga.  Short bevel or Quincke spinal needle  Needle Placement: Transforaminal  Findings:    -Comments: Excellent flow of contrast along the nerve, nerve root and into the epidural space.  Procedure Details: After squaring off the end-plates to get a true AP view, the C-arm was positioned so that an oblique view of the foramen as noted above was visualized. The target area is just inferior to the "nose of the scotty dog" or sub pedicular. The soft tissues overlying this structure were infiltrated with 2-3 ml. of 1% Lidocaine without Epinephrine.  The spinal needle was inserted toward the target using a "trajectory" view along the fluoroscope beam.  Under AP and lateral visualization, the needle was advanced so it did not puncture dura and was located close the 6 O'Clock position of the pedical in AP tracterory. Biplanar projections were used to confirm position. Aspiration was confirmed to be negative for CSF and/or blood. A 1-2 ml. volume of Isovue-250 was injected and flow of contrast was noted at each level. Radiographs were obtained for documentation purposes.   After attaining the  desired flow of contrast documented above, a 0.5 to 1.0 ml test dose of 0.25% Marcaine was injected into each respective transforaminal space.  The patient was observed for 90 seconds post injection.  After no sensory deficits were reported, and normal lower extremity motor function was noted,   the above injectate was administered so that equal amounts of the injectate were placed at each foramen (level) into the transforaminal epidural space.   Additional Comments:  The patient tolerated the procedure well Dressing: 2 x 2 sterile gauze and Band-Aid    Post-procedure details: Patient was observed during the procedure. Post-procedure instructions were reviewed.  Patient left the clinic in stable condition.

## 2021-02-09 NOTE — Progress Notes (Signed)
[name] - 50 y.o. female MRN [id number]  Date of birth: Apr 13, 1971  Office Visit Note: Visit Date: 01/25/2021 PCP: Mayra Neer, MD Referred by: Mayra Neer, MD  Subjective: Chief Complaint  Patient presents with   Lower Back - Pain   HPI:  [name] is a 50 y.o. female who comes in today for planned repeat Right L4-5  Lumbar Transforaminal epidural steroid injection with fluoroscopic guidance.  The patient has failed conservative care including home exercise, medications, time and activity modification.  This injection will be diagnostic and hopefully therapeutic.  Please see requesting physician notes for further details and justification. Patient received more than 50% pain relief from prior injection.  Symptoms more consistent with an L4 dermatome but MRI findings more consistent with some lateral recess narrowing potentially at L4-5 which she would expect affect the L5 nerve root.  No high-grade compression or stenosis.  She did get relief with the prior injection just did not last very long.  We will repeat this injection today.  Referring: Dr. Jean Rosenthal   ROS Otherwise per HPI.  Assessment & Plan: Visit Diagnoses:    ICD-10-CM   1. Lumbar radiculopathy  M54.16 XR C-ARM NO REPORT    Epidural Steroid injection    methylPREDNISolone acetate (DEPO-MEDROL) injection 80 mg      Plan: No additional findings.   Meds & Orders:  Meds ordered this encounter  Medications   methylPREDNISolone acetate (DEPO-MEDROL) injection 80 mg    Orders Placed This Encounter  Procedures   XR C-ARM NO REPORT   Epidural Steroid injection    Follow-up: Return if symptoms worsen or fail to improve.   Procedures: No procedures performed  Lumbosacral Transforaminal Epidural Steroid Injection - Sub-Pedicular Approach with Fluoroscopic Guidance  Patient: [name]      Date of Birth: 1970-08-20 MRN: [id number] PCP: Mayra Neer,  MD      Visit Date: 01/25/2021   Universal Protocol:    Date/Time: 01/25/2021  Consent Given By: the patient  Position: PRONE  Additional Comments: Vital signs were monitored before and after the procedure. Patient was prepped and draped in the usual sterile fashion. The correct patient, procedure, and site was verified.   Injection Procedure Details:   Procedure diagnoses: Lumbar radiculopathy [M54.16]    Meds Administered:  Meds ordered this encounter  Medications   methylPREDNISolone acetate (DEPO-MEDROL) injection 80 mg    Laterality: Right  Location/Site: L4  Needle:5.0 in., 22 ga.  Short bevel or Quincke spinal needle  Needle Placement: Transforaminal  Findings:    -Comments: Excellent flow of contrast along the nerve, nerve root and into the epidural space.  Procedure Details: After squaring off the end-plates to get a true AP view, the C-arm was positioned so that an oblique view of the foramen as noted above was visualized. The target area is just inferior to the "nose of the scotty dog" or sub pedicular. The soft tissues overlying this structure were infiltrated with 2-3 ml. of 1% Lidocaine without Epinephrine.  The spinal needle was inserted toward the target using a "trajectory" view along the fluoroscope beam.  Under AP and lateral visualization, the needle was advanced so it did not puncture dura and was located close the 6 O'Clock position of the pedical in AP tracterory. Biplanar projections were used to confirm position. Aspiration was confirmed to be negative for CSF and/or blood. A 1-2 ml. volume of Isovue-250 was injected and flow of contrast was noted at each  level. Radiographs were obtained for documentation purposes.   After attaining the desired flow of contrast documented above, a 0.5 to 1.0 ml test dose of 0.25% Marcaine was injected into each respective transforaminal space.  The patient was observed for 90 seconds post injection.  After no  sensory deficits were reported, and normal lower extremity motor function was noted,   the above injectate was administered so that equal amounts of the injectate were placed at each foramen (level) into the transforaminal epidural space.   Additional Comments:  The patient tolerated the procedure well Dressing: 2 x 2 sterile gauze and Band-Aid    Post-procedure details: Patient was observed during the procedure. Post-procedure instructions were reviewed.  Patient left the clinic in stable condition.    Clinical History: CLINICAL DATA:  Worsening right-sided low back pain and right leg weakness. Recent trauma.   EXAM: MRI LUMBAR SPINE WITHOUT CONTRAST   TECHNIQUE: Multiplanar, multisequence MR imaging of the lumbar spine was performed. No intravenous contrast was administered.   COMPARISON:  Radiography 08/11/2019   FINDINGS: Segmentation:  5 lumbar type vertebral bodies.   Alignment:  Normal   Vertebrae:  Normal   Conus medullaris and cauda equina: Conus extends to the L1 level. Conus and cauda equina appear normal.   Paraspinal and other soft tissues: Normal   Disc levels:   No abnormality at L3-4 or above.   L4-5: Mild disc degeneration with annular bulging slightly more prominent towards the right. Mild stenosis both lateral recesses. Some potential that either L5 nerve could be affected.   L5-S1: Normal interspace.   IMPRESSION: Single level abnormality at L4-5 with annular bulging. Mild stenosis of both lateral recesses that would have potential to affect either or both L5 nerves.     Electronically Signed   By: Nelson Chimes M.D.   On: 09/19/2019 13:39     Objective:  VS:  HT:    WT:   BMI:     BP:122/84  HR:89bpm  TEMP: ( )  RESP:  Physical Exam Vitals and nursing note reviewed.  Constitutional:      General: She is not in acute distress.    Appearance: Normal appearance. She is not ill-appearing.  HENT:     Head: Normocephalic and  atraumatic.     Right Ear: External ear normal.     Left Ear: External ear normal.  Eyes:     Extraocular Movements: Extraocular movements intact.  Cardiovascular:     Rate and Rhythm: Normal rate.     Pulses: Normal pulses.  Pulmonary:     Effort: Pulmonary effort is normal. No respiratory distress.  Abdominal:     General: There is no distension.     Palpations: Abdomen is soft.  Musculoskeletal:        General: Tenderness present.     Cervical back: Neck supple.     Right lower leg: No edema.     Left lower leg: No edema.     Comments: Patient has good distal strength with no pain over the greater trochanters.  No clonus or focal weakness.  Skin:    Findings: No erythema, lesion or rash.  Neurological:     General: No focal deficit present.     Mental Status: She is alert and oriented to person, place, and time.     Sensory: No sensory deficit.     Motor: No weakness or abnormal muscle tone.     Coordination: Coordination normal.  Psychiatric:  Mood and Affect: Mood normal.        Behavior: Behavior normal.     Imaging: No results found.

## 2021-02-15 ENCOUNTER — Telehealth: Payer: Self-pay | Admitting: Physical Medicine and Rehabilitation

## 2021-02-15 DIAGNOSIS — M5416 Radiculopathy, lumbar region: Secondary | ICD-10-CM

## 2021-02-15 NOTE — Telephone Encounter (Signed)
Pt called requesting a call back to set an appt. Please call pt at [phone]

## 2021-02-16 ENCOUNTER — Telehealth: Payer: Self-pay | Admitting: Physical Medicine and Rehabilitation

## 2021-02-16 NOTE — Telephone Encounter (Signed)
Patient called. Returning a call to [place].

## 2021-02-16 NOTE — Telephone Encounter (Signed)
Left message #1. Patient had right L4 TF on 10/12. Need to find out if calling for same problem and if she had relief from injection.

## 2021-02-17 NOTE — Telephone Encounter (Signed)
Referral placed and patient scheduled.

## 2021-02-17 NOTE — Telephone Encounter (Signed)
Patient states that she had no relief from injection on 10/12. Please advise.

## 2021-02-17 NOTE — Telephone Encounter (Signed)
See previous message

## 2021-03-02 ENCOUNTER — Ambulatory Visit: Payer: Self-pay

## 2021-03-02 ENCOUNTER — Encounter: Payer: Self-pay | Admitting: Physical Medicine and Rehabilitation

## 2021-03-02 ENCOUNTER — Other Ambulatory Visit: Payer: Self-pay

## 2021-03-02 ENCOUNTER — Ambulatory Visit: Payer: BC Managed Care – PPO | Admitting: Physical Medicine and Rehabilitation

## 2021-03-02 VITALS — BP 134/82 | HR 90

## 2021-03-02 DIAGNOSIS — M5416 Radiculopathy, lumbar region: Secondary | ICD-10-CM | POA: Diagnosis not present

## 2021-03-02 MED ORDER — BETAMETHASONE SOD PHOS & ACET 6 (3-3) MG/ML IJ SUSP
12.0000 mg | Freq: Once | INTRAMUSCULAR | Status: AC
  Administered 2021-03-02: 09:00:00 12 mg

## 2021-03-02 NOTE — Progress Notes (Signed)
[name] - 50 y.o. female MRN [id number]  Date of birth: June 28, 1970  Office Visit Note: Visit Date: 03/02/2021 PCP: Mayra Neer, MD Referred by: Mayra Neer, MD  Subjective: Chief Complaint  Patient presents with   Lower Back - Pain   HPI:  [name] is a 50 y.o. female who comes in today at the request of Dr. Jean Rosenthal for planned Right L4-5 Lumbar Interlaminar epidural steroid injection with fluoroscopic guidance.  The patient has failed conservative care including home exercise, medications, time and activity modification.  This injection will be diagnostic and hopefully therapeutic.  Please see requesting physician notes for further details and justification.  ROS Otherwise per HPI.  Assessment & Plan: Visit Diagnoses:    ICD-10-CM   1. Lumbar radiculopathy  M54.16 XR C-ARM NO REPORT    Epidural Steroid injection    betamethasone acetate-betamethasone sodium phosphate (CELESTONE) injection 12 mg      Plan: No additional findings.   Meds & Orders:  Meds ordered this encounter  Medications   betamethasone acetate-betamethasone sodium phosphate (CELESTONE) injection 12 mg    Orders Placed This Encounter  Procedures   XR C-ARM NO REPORT   Epidural Steroid injection    Follow-up: Return if symptoms worsen or fail to improve.   Procedures: No procedures performed  Lumbar Epidural Steroid Injection - Interlaminar Approach with Fluoroscopic Guidance  Patient: [name]      Date of Birth: 03-19-1971 MRN: [id number] PCP: Mayra Neer, MD      Visit Date: 03/02/2021   Universal Protocol:     Consent Given By: the patient  Position: PRONE  Additional Comments: Vital signs were monitored before and after the procedure. Patient was prepped and draped in the usual sterile fashion. The correct patient, procedure, and site was verified.   Injection Procedure Details:   Procedure diagnoses: Lumbar  radiculopathy [M54.16]   Meds Administered:  Meds ordered this encounter  Medications   betamethasone acetate-betamethasone sodium phosphate (CELESTONE) injection 12 mg     Laterality: Right  Location/Site:  L4-5  Needle: 3.5 in., 20 ga. Tuohy  Needle Placement: Paramedian epidural  Findings:   -Comments: Excellent flow of contrast into the epidural space.  Procedure Details: Using a paramedian approach from the side mentioned above, the region overlying the inferior lamina was localized under fluoroscopic visualization and the soft tissues overlying this structure were infiltrated with 4 ml. of 1% Lidocaine without Epinephrine. The Tuohy needle was inserted into the epidural space using a paramedian approach.   The epidural space was localized using loss of resistance along with counter oblique bi-planar fluoroscopic views.  After negative aspirate for air, blood, and CSF, a 2 ml. volume of Isovue-250 was injected into the epidural space and the flow of contrast was observed. Radiographs were obtained for documentation purposes.    The injectate was administered into the level noted above.   Additional Comments:  No complications occurred Dressing: 2 x 2 sterile gauze and Band-Aid    Post-procedure details: Patient was observed during the procedure. Post-procedure instructions were reviewed.  Patient left the clinic in stable condition.   Clinical History: CLINICAL DATA:  Worsening right-sided low back pain and right leg weakness. Recent trauma.   EXAM: MRI LUMBAR SPINE WITHOUT CONTRAST   TECHNIQUE: Multiplanar, multisequence MR imaging of the lumbar spine was performed. No intravenous contrast was administered.   COMPARISON:  Radiography 08/11/2019   FINDINGS: Segmentation:  5 lumbar type vertebral bodies.   Alignment:  Normal   Vertebrae:  Normal   Conus medullaris and cauda equina: Conus extends to the L1 level. Conus and cauda equina appear normal.    Paraspinal and other soft tissues: Normal   Disc levels:   No abnormality at L3-4 or above.   L4-5: Mild disc degeneration with annular bulging slightly more prominent towards the right. Mild stenosis both lateral recesses. Some potential that either L5 nerve could be affected.   L5-S1: Normal interspace.   IMPRESSION: Single level abnormality at L4-5 with annular bulging. Mild stenosis of both lateral recesses that would have potential to affect either or both L5 nerves.     Electronically Signed   By: Nelson Chimes M.D.   On: 09/19/2019 13:39     Objective:  VS:  HT:    WT:   BMI:     BP:134/82  HR:90bpm  TEMP: ( )  RESP:  Physical Exam Vitals and nursing note reviewed.  Constitutional:      General: She is not in acute distress.    Appearance: Normal appearance. She is not ill-appearing.  HENT:     Head: Normocephalic and atraumatic.     Right Ear: External ear normal.     Left Ear: External ear normal.  Eyes:     Extraocular Movements: Extraocular movements intact.  Cardiovascular:     Rate and Rhythm: Normal rate.     Pulses: Normal pulses.  Pulmonary:     Effort: Pulmonary effort is normal. No respiratory distress.  Abdominal:     General: There is no distension.     Palpations: Abdomen is soft.  Musculoskeletal:        General: Tenderness present.     Cervical back: Neck supple.     Right lower leg: No edema.     Left lower leg: No edema.     Comments: Patient has good distal strength with no pain over the greater trochanters.  No clonus or focal weakness.  Skin:    Findings: No erythema, lesion or rash.  Neurological:     General: No focal deficit present.     Mental Status: She is alert and oriented to person, place, and time.     Sensory: No sensory deficit.     Motor: No weakness or abnormal muscle tone.     Coordination: Coordination normal.  Psychiatric:        Mood and Affect: Mood normal.        Behavior: Behavior normal.      Imaging: No results found.

## 2021-03-02 NOTE — Progress Notes (Signed)
Pt state lower back pain that travels to her right calf. Pt state walking, standing and movement makes the pain worse. Pt state she takes pain meds and uses heat to help ease her pain. Pt has hx of inj on 01/25/21 pt state it didn't help.  Numeric Pain Rating Scale and Functional Assessment Average Pain 2   In the last MONTH (on 0-10 scale) has pain interfered with the following?  1. General activity like being  able to carry out your everyday physical activities such as walking, climbing stairs, carrying groceries, or moving a chair?  Rating(9)   +Driver, -BT, -Dye Allergies.

## 2021-03-02 NOTE — Patient Instructions (Signed)

## 2021-03-13 ENCOUNTER — Telehealth: Payer: Self-pay | Admitting: Physical Medicine and Rehabilitation

## 2021-03-13 NOTE — Telephone Encounter (Signed)
Pt got L5-S1 interlam on 03/02/2021. Pt states injection 100% worked for about 5 days and now the pain is back.  CB [phone]

## 2021-03-29 ENCOUNTER — Telehealth: Payer: Self-pay | Admitting: Physical Medicine and Rehabilitation

## 2021-03-29 NOTE — Telephone Encounter (Signed)
Pt rescheduled

## 2021-03-29 NOTE — Telephone Encounter (Signed)
Pt calling to get her appt on the 20th resch'd (inj is authorized per sch). The best call back number for the pt is [phone].

## 2021-03-30 DIAGNOSIS — Z01411 Encounter for gynecological examination (general) (routine) with abnormal findings: Secondary | ICD-10-CM | POA: Diagnosis not present

## 2021-03-30 DIAGNOSIS — Z6836 Body mass index (BMI) 36.0-36.9, adult: Secondary | ICD-10-CM | POA: Diagnosis not present

## 2021-03-30 DIAGNOSIS — Z124 Encounter for screening for malignant neoplasm of cervix: Secondary | ICD-10-CM | POA: Diagnosis not present

## 2021-03-30 DIAGNOSIS — Z01419 Encounter for gynecological examination (general) (routine) without abnormal findings: Secondary | ICD-10-CM | POA: Diagnosis not present

## 2021-03-30 DIAGNOSIS — Z1231 Encounter for screening mammogram for malignant neoplasm of breast: Secondary | ICD-10-CM | POA: Diagnosis not present

## 2021-04-02 NOTE — Procedures (Signed)
Lumbar Epidural Steroid Injection - Interlaminar Approach with Fluoroscopic Guidance  Patient: [name]      Date of Birth: 05-Apr-1971 MRN: [id number] PCP: Mayra Neer, MD      Visit Date: 03/02/2021   Universal Protocol:     Consent Given By: the patient  Position: PRONE  Additional Comments: Vital signs were monitored before and after the procedure. Patient was prepped and draped in the usual sterile fashion. The correct patient, procedure, and site was verified.   Injection Procedure Details:   Procedure diagnoses: Lumbar radiculopathy [M54.16]   Meds Administered:  Meds ordered this encounter  Medications   betamethasone acetate-betamethasone sodium phosphate (CELESTONE) injection 12 mg     Laterality: Right  Location/Site:  L4-5  Needle: 3.5 in., 20 ga. Tuohy  Needle Placement: Paramedian epidural  Findings:   -Comments: Excellent flow of contrast into the epidural space.  Procedure Details: Using a paramedian approach from the side mentioned above, the region overlying the inferior lamina was localized under fluoroscopic visualization and the soft tissues overlying this structure were infiltrated with 4 ml. of 1% Lidocaine without Epinephrine. The Tuohy needle was inserted into the epidural space using a paramedian approach.   The epidural space was localized using loss of resistance along with counter oblique bi-planar fluoroscopic views.  After negative aspirate for air, blood, and CSF, a 2 ml. volume of Isovue-250 was injected into the epidural space and the flow of contrast was observed. Radiographs were obtained for documentation purposes.    The injectate was administered into the level noted above.   Additional Comments:  No complications occurred Dressing: 2 x 2 sterile gauze and Band-Aid    Post-procedure details: Patient was observed during the procedure. Post-procedure instructions were reviewed.  Patient left the clinic in  stable condition.

## 2021-04-04 ENCOUNTER — Ambulatory Visit: Payer: BC Managed Care – PPO | Admitting: Physical Medicine and Rehabilitation

## 2021-04-05 DIAGNOSIS — J0140 Acute pansinusitis, unspecified: Secondary | ICD-10-CM | POA: Diagnosis not present

## 2021-04-24 ENCOUNTER — Encounter: Payer: Self-pay | Admitting: Physical Medicine and Rehabilitation

## 2021-04-24 ENCOUNTER — Other Ambulatory Visit: Payer: Self-pay

## 2021-04-24 ENCOUNTER — Ambulatory Visit: Payer: Self-pay

## 2021-04-24 ENCOUNTER — Ambulatory Visit: Payer: BC Managed Care – PPO | Admitting: Physical Medicine and Rehabilitation

## 2021-04-24 VITALS — BP 139/88 | HR 77

## 2021-04-24 DIAGNOSIS — M542 Cervicalgia: Secondary | ICD-10-CM | POA: Diagnosis not present

## 2021-04-24 DIAGNOSIS — M5416 Radiculopathy, lumbar region: Secondary | ICD-10-CM

## 2021-04-24 DIAGNOSIS — M5116 Intervertebral disc disorders with radiculopathy, lumbar region: Secondary | ICD-10-CM

## 2021-04-24 DIAGNOSIS — M48062 Spinal stenosis, lumbar region with neurogenic claudication: Secondary | ICD-10-CM | POA: Diagnosis not present

## 2021-04-24 DIAGNOSIS — M7918 Myalgia, other site: Secondary | ICD-10-CM

## 2021-04-24 DIAGNOSIS — M797 Fibromyalgia: Secondary | ICD-10-CM

## 2021-04-24 MED ORDER — PREGABALIN 75 MG PO CAPS: 75.0000 mg | ORAL_CAPSULE | Freq: Two times a day (BID) | ORAL | 1 refills | Status: DC

## 2021-04-24 MED ORDER — METHYLPREDNISOLONE ACETATE 80 MG/ML IJ SUSP: 80.0000 mg | Freq: Once | INTRAMUSCULAR | Status: DC

## 2021-04-24 NOTE — Progress Notes (Signed)
° °  Procedure Note  Patient: [name]             Date of Birth: [birth year]           MRN: [id number]             Visit Date: 04/24/2021  Procedures: Visit Diagnoses:  1. Lumbar radiculopathy   2. Radiculopathy due to lumbar intervertebral disc disorder     IM injection  Date/Time: 04/24/2021 3:25 PM Performed by: Lorine Bears, NP Authorized by: Lorine Bears, NP   Consent Given by:  Patient Timeout: prior to procedure the correct patient, procedure, and site was verified   Indications:  Pain Total # of Trigger Points:  1 Needle Size:  22 G Medications #1:  60 mg ketorolac 30 MG/ML Comments: Pt tolerated IM injection without difficulty.

## 2021-04-24 NOTE — Progress Notes (Signed)
Pt state lower back pain that travels to her right hip and leg. Pt state any movement makes the pain worse. Pt state she takes pain meds to help ease her pain.  Numeric Pain Rating Scale and Functional Assessment Average Pain 3   In the last MONTH (on 0-10 scale) has pain interfered with the following?  1. General activity like being  able to carry out your everyday physical activities such as walking, climbing stairs, carrying groceries, or moving a chair?  Rating(10)   +Driver, -BT, -Dye Allergies.

## 2021-04-25 ENCOUNTER — Encounter: Payer: Self-pay | Admitting: Physical Medicine and Rehabilitation

## 2021-04-25 DIAGNOSIS — G8929 Other chronic pain: Secondary | ICD-10-CM | POA: Insufficient documentation

## 2021-04-25 DIAGNOSIS — G43909 Migraine, unspecified, not intractable, without status migrainosus: Secondary | ICD-10-CM | POA: Insufficient documentation

## 2021-04-25 DIAGNOSIS — N951 Menopausal and female climacteric states: Secondary | ICD-10-CM | POA: Insufficient documentation

## 2021-04-25 DIAGNOSIS — Z86010 Personal history of colonic polyps: Secondary | ICD-10-CM | POA: Insufficient documentation

## 2021-04-25 MED ORDER — KETOROLAC TROMETHAMINE 30 MG/ML IJ SOLN
60.0000 mg | INTRAMUSCULAR | Status: AC | PRN
  Administered 2021-04-24: 60 mg via INTRAMUSCULAR

## 2021-04-25 NOTE — Telephone Encounter (Signed)
noted 

## 2021-04-25 NOTE — Progress Notes (Signed)
[name] - 50 y.o. female MRN [id number]  Date of birth: 19-Oct-1970  Office Visit Note: Visit Date: 04/24/2021 PCP: Mayra Neer, MD Referred by: Mayra Neer, MD  Subjective: Chief Complaint  Patient presents with   Lower Back - Pain   Right Hip - Pain   Right Leg - Pain   HPI: [name] is a 51 y.o. female who comes in todayFor evaluation management of chronic worsening back pain with right-sided radicular type symptoms in the hip and leg but with really all over body pain and that everything to her hurts at this point.  Neck pain and shoulder pain as well as mid back pain low back pain and hip pain.  She denies any new trauma.  She was actually coming in today for potential repeat injection but with further talking with her the last injection really did not help but may be a few days.  She is here with her husband today who I have not met before.  She is tearful in the office of just the amount of pain she has been having with no resolution.  She also has seen Dr. Kathyrn Sheriff at [facility] [place] in August.  I am not sure of any follow-up with him but no surgery was planned.  She has had pain medications in the past and not really tolerated or does not want anything like oxycodone.  She does take Celebrex but this not really helping.  By way of brief review she came to see me through Dr. Jean Rosenthal from an orthopedic standpoint.  He saw her in initial evaluation through her primary care physician Dr. Serita Grammes.  At the time she was having mainly frank radicular symptoms on the right.  Epidural injection did seem to help at the time.  MRI did show multifactorial mild to moderate stenosis at L4-5 without listhesis or frank nerve compression.  Unfortunately the epidural injections over the last several times have just not helped for very long or very much and the symptoms now seem to have a significant emotional quality and the  fact that any kind of movement is just excruciating.  Her husband today comments that he has had back pain for many years and his wife just cannot go on like this and she needs resolution.  Had a long talk with him about her pain pattern at this point being really kind of all over the place and not specifically radicular and confined to one spot.  Her symptoms are worse with any kind of movement whatsoever.  Her MRI is fairly new from 2021.  She has had physical therapy in the past but not recently.  She does not really take any other medications for her pain control at this point.  She has never been diagnosed with fibromyalgia but she does report some family members that she knows do have this.  She does have a history of anxiety disorder.     Review of Systems  Musculoskeletal:  Positive for back pain, joint pain and neck pain.  All other systems reviewed and are negative. Otherwise per HPI.  Assessment & Plan: Visit Diagnoses:    ICD-10-CM   1. Lumbar radiculopathy  M54.16 XR C-ARM NO REPORT    IM injection    Ambulatory referral to Physical Medicine Rehab    DISCONTINUED: methylPREDNISolone acetate (DEPO-MEDROL) injection 80 mg    CANCELED: Epidural Steroid injection    2. 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 Cervicalgia  M54.2 Ambulatory referral to Physical Medicine Rehab    5. Myofascial pain syndrome  M79.18 Ambulatory referral to Physical Medicine Rehab    6. Fibromyalgia  M79.7 Ambulatory referral to Physical Medicine Rehab       Plan: Findings:  Chronic worsening severe low back pain mainly right hip pain some to the  left with some referral into the hip and leg no specific groin pain.  No pain getting out of the car but any sort of movement if she sitting for a while gives her severe jolting type pain.  Her biggest pain is in the lower back but she does have pain in other places to which she comments today that her pain really is everywhere.  She does not endorse any insomnia.  She does try to stay active but cannot really do anything at this point because of the pain.  I did take the liberty today to have her complete a widespread pain index and she scored basically just one-point away from their diagnostic criteria for fibromyalgia.  She does have tender points but some trigger points as well.  I had a long discussion today with her and her husband about her pain complaints and we went over her MRI once again.  I think the best approach is to try Lyrica which we will start at 75 mg at night for 7 days and then try to get her to twice daily and I talked about side effects from that.  I also want to try a Toradol injection for pain relief.  Her husband reports that he gets a Toradol injection about every 4 to 5 months at [facility] [place] and this seems to keep his pain and checked and she wants to follow through with this since it is helped him.  I did have my nurse practitioner do that injection as dictated separately.  I also want to refer her to [facility] as I think her pain at this point really needs a multifactorial approach including different therapies medication therapy possible injection and possible psychological support.  She does not really want necessarily chronic opioid treatment and does not tolerate oxycodone.  I would entertain the idea of having her regroup with Dr. Kathyrn Sheriff from a surgery standpoint but I am not sure that surgery is going to relieve all of her symptoms at this point although it would be a consideration with the stenosis that she has in the lumbar  spine.  We will also entertain the idea of updating MRI of the lumbar spine but again her pain pattern is more multifactorial than just radicular pain at this point.  She is going to send [place] a MyChart message regarding the Toradol injection results.   Meds & Orders:  Meds ordered this encounter  Medications   DISCONTD: methylPREDNISolone acetate (DEPO-MEDROL) injection 80 mg   pregabalin (LYRICA) 75 MG capsule    Sig: Take 1 capsule (75 mg total) by mouth 2 (two) times daily.    Dispense:  60 capsule    Refill:  1    Orders Placed This Encounter  Procedures   IM injection  XR C-ARM NO REPORT   Ambulatory referral to Physical Medicine Rehab    Follow-up: Return if symptoms worsen or fail to improve.   Procedures: No procedures performed      Clinical History: CLINICAL DATA:  Worsening right-sided low back pain and right leg weakness. Recent trauma.   EXAM: MRI LUMBAR SPINE WITHOUT CONTRAST   TECHNIQUE: Multiplanar, multisequence MR imaging of the lumbar spine was performed. No intravenous contrast was administered.   COMPARISON:  Radiography 08/11/2019   FINDINGS: Segmentation:  5 lumbar type vertebral bodies.   Alignment:  Normal   Vertebrae:  Normal   Conus medullaris and cauda equina: Conus extends to the L1 level. Conus and cauda equina appear normal.   Paraspinal and other soft tissues: Normal   Disc levels:   No abnormality at L3-4 or above.   L4-5: Mild disc degeneration with annular bulging slightly more prominent towards the right. Mild stenosis both lateral recesses. Some potential that either L5 nerve could be affected.   L5-S1: Normal interspace.   IMPRESSION: Single level abnormality at L4-5 with annular bulging. Mild stenosis of both lateral recesses that would have potential to affect either or both L5 nerves.     Electronically Signed   By: Nelson Chimes M.D.   On: 09/19/2019 13:39   She reports that she has quit smoking. She has  never used smokeless tobacco. No results for input(s): HGBA1C, LABURIC in the last 8760 hours.  Objective:  VS:  HT:     WT:    BMI:      BP:139/88   HR:77bpm   TEMP: ( )   RESP:  Physical Exam Vitals and nursing note reviewed.  Constitutional:      General: She is not in acute distress.    Appearance: Normal appearance. She is well-developed.  HENT:     Head: Normocephalic and atraumatic.     Nose: Nose normal.     Mouth/Throat:     Mouth: Mucous membranes are moist.     Pharynx: Oropharynx is clear.  Eyes:     Conjunctiva/sclera: Conjunctivae normal.     Pupils: Pupils are equal, round, and reactive to light.  Cardiovascular:     Rate and Rhythm: Regular rhythm.  Pulmonary:     Effort: Pulmonary effort is normal. No respiratory distress.  Abdominal:     General: There is no distension.     Palpations: Abdomen is soft.     Tenderness: There is no guarding.  Musculoskeletal:     Cervical back: Normal range of motion and neck supple.     Right lower leg: No edema.     Left lower leg: No edema.     Comments: She ambulates without aid.  Any movement in the chair does elicit some pain.  Equivocally positive slump test.  Good distal strength.  Skin:    General: Skin is warm and dry.     Findings: No erythema or rash.  Neurological:     General: No focal deficit present.     Mental Status: She is alert and oriented to person, place, and time.     Motor: No abnormal muscle tone.     Coordination: Coordination normal.     Gait: Gait normal.  Psychiatric:        Mood and Affect: Mood normal.        Behavior: Behavior normal.        Thought Content: Thought content normal.    Ortho Exam  Imaging:  No results found.  Past Medical/Family/Surgical/Social History: Medications & Allergies reviewed per EMR, new medications updated. Patient Active Problem List   Diagnosis Date Noted   Migraine 04/25/2021   Menopausal symptom 04/25/2021   History of adenomatous polyp of colon  04/25/2021   Chronic low back pain 04/25/2021   Obesity 11/16/2020   Past Medical History:  Diagnosis Date   Allergy    Anxiety    Family History  Problem Relation Age of Onset   Breast cancer Paternal Grandmother    Kidney cancer Maternal Aunt    Kidney cancer Maternal Grandfather    Colon polyps Neg Hx    Colon cancer Neg Hx    Esophageal cancer Neg Hx    Rectal cancer Neg Hx    Stomach cancer Neg Hx    Past Surgical History:  Procedure Laterality Date   APPENDECTOMY     CESAREAN SECTION     x1   CHOLECYSTECTOMY     FOOT SURGERY     LASIK     NOSE SURGERY     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

## 2021-05-01 ENCOUNTER — Encounter: Payer: Self-pay | Admitting: Physical Medicine and Rehabilitation

## 2021-05-17 ENCOUNTER — Other Ambulatory Visit: Payer: Self-pay

## 2021-05-17 ENCOUNTER — Ambulatory Visit
Admission: EM | Admit: 2021-05-17 | Discharge: 2021-05-17 | Disposition: A | Discharge status: Home / Self Care | Payer: BC Managed Care – PPO | Attending: Internal Medicine | Admitting: Internal Medicine

## 2021-05-17 DIAGNOSIS — H66012 Acute suppurative otitis media with spontaneous rupture of ear drum, left ear: Secondary | ICD-10-CM | POA: Diagnosis not present

## 2021-05-17 DIAGNOSIS — J069 Acute upper respiratory infection, unspecified: Secondary | ICD-10-CM | POA: Diagnosis not present

## 2021-05-17 MED ORDER — AZITHROMYCIN 500 MG PO TABS: ORAL_TABLET | ORAL | 0 refills | Status: DC

## 2021-05-17 MED ORDER — DOXYCYCLINE HYCLATE 100 MG PO CAPS: 100.0000 mg | ORAL_CAPSULE | Freq: Two times a day (BID) | ORAL | 0 refills | Status: DC

## 2021-05-17 NOTE — ED Provider Notes (Signed)
[facility]    CSN: [id number] Arrival date & time: 05/17/21  0804      History   Chief Complaint Chief Complaint  Patient presents with   Sore Throat    HPI [name] is a 51 y.o. female.   Patient presents with nonproductive cough, sore throat, nasal congestion, bilateral ear discomfort that has been present for approximately 4 days.  Denies any known fevers.  Her grandchild has had similar symptoms recently.  She reports that her left ear is "leaking".  She also has some decreased hearing from the ear at times.  Has taken DayQuil and NyQuil with some improvement in symptoms.  Denies chest pain, shortness of breath, nausea, vomiting, diarrhea, abdominal pain.   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      Home Medications    Prior to Admission medications   Medication Sig Start Date End Date Taking? Authorizing Provider  doxycycline (VIBRAMYCIN) 100 MG capsule Take 1 capsule (100 mg total) by mouth 2 (two) times daily. 05/17/21  Yes Henli Hey, Hildred Alamin E, FNP  Ascorbic Acid (VITAMIN C) 1000 MG tablet Take 1,000 mg by mouth daily.    [provider]  B Complex Vitamins (B COMPLEX PO) Take by mouth.    [provider]  Biotin 5000 MCG CAPS Take by mouth.    [provider]  celecoxib (CELEBREX) 200 MG capsule TAKE 1 CAPSULE (200 MG TOTAL) BY MOUTH 2 (TWO) TIMES  DAILY BETWEEN MEALS AS NEEDED. 01/16/21   Mcarthur Rossetti, MD  Cholecalciferol (VITAMIN D) 50 MCG (2000 UT) CAPS Vitamin D    [provider]  diclofenac (FLECTOR) 1.3 % PTCH Place 1 patch onto the skin 2 (two) times daily. 09/19/19   Carmin Muskrat, MD  folic acid (FOLVITE) 696 MCG tablet Take 400 mcg by mouth daily.    [provider]  loratadine (CLARITIN) 10 MG tablet Take 10 mg by mouth daily.    [provider]  Melatonin 5 MG CAPS Take by mouth.    [provider]  methylPREDNISolone (MEDROL) 4 MG tablet Medrol dose pack. Take as instructed 09/19/20   Mcarthur Rossetti, MD  Multiple Vitamin (MULTIVITAMIN) tablet Take 1 tablet by mouth daily.    [provider]  pregabalin (LYRICA) 75 MG capsule Take 1 capsule (75 mg total) by mouth 2 (two) times daily. 04/24/21   Magnus Sinning, MD  Probiotic Product (PROBIOTIC DAILY PO) Take by mouth.    [provider]  QUERCETIN PO Take by mouth.    [provider]  sertraline (ZOLOFT) 50 MG tablet Take 50 mg by mouth daily. 02/21/20   [provider]    Family  History Family History  Problem Relation Age of Onset   Breast cancer Paternal Grandmother    Kidney cancer Maternal Aunt    Kidney cancer Maternal Grandfather    Colon polyps Neg Hx    Colon cancer Neg Hx    Esophageal cancer Neg Hx    Rectal cancer Neg Hx    Stomach cancer Neg Hx     Social History Social History   Tobacco Use   Smoking status: Former   Smokeless tobacco: Never  Substance Use Topics   Alcohol use: Yes    Comment: social- rare    Drug use: Never     Allergies   Acetaminophen, Oxycodone, Oxycodone-acetaminophen, and Penicillins   Review of Systems Review of Systems Per HPI  Physical Exam Triage Vital Signs ED Triage Vitals [05/17/21 0819]  Enc Vitals Group     BP 131/81     Pulse Rate 83     Resp 18     Temp 98.3 F (36.8 C)     Temp Source Oral     SpO2 98 %      Weight      Height      Head Circumference      Peak Flow      Pain Score 0     Pain Loc      Pain Edu?      Excl. in [place]?    No data found.  Updated Vital Signs BP 131/81 (BP Location: Left Arm)    Pulse 83    Temp 98.3 F (36.8 C) (Oral)    Resp 18    SpO2 98%   Visual Acuity Right Eye Distance:   Left Eye Distance:   Bilateral Distance:    Right Eye Near:   Left Eye Near:    Bilateral Near:     Physical Exam Constitutional:      General: She is not in acute distress.    Appearance: Normal appearance. She is not toxic-appearing.  HENT:     Head: Normocephalic and atraumatic.     Right Ear: Tympanic membrane and ear canal normal.     Left Ear: Ear canal normal. Drainage present. No swelling or tenderness.  No middle ear effusion. Tympanic membrane is perforated and erythematous. Tympanic membrane is not bulging.     Ears:     Comments: There is no obvious perforation noted on physical exam but there is purulent drainage coming from tympanic membrane which could indicate perforation.  There is also erythema noted to tympanic membrane.    Nose: Congestion present.     Mouth/Throat:     Mouth: Mucous membranes are moist.     Pharynx: No posterior oropharyngeal erythema.  Eyes:     Extraocular Movements: Extraocular movements intact.     Conjunctiva/sclera: Conjunctivae normal.     Pupils: Pupils are equal, round, and reactive to light.  Cardiovascular:     Rate and Rhythm: Normal rate and regular rhythm.     Pulses: Normal pulses.     Heart sounds: Normal heart sounds.  Pulmonary:     Effort: Pulmonary effort is normal. No respiratory distress.     Breath sounds: Normal breath sounds. No stridor. No wheezing, rhonchi or rales.  Abdominal:     General: Abdomen is flat. Bowel sounds are normal.     Palpations: Abdomen is soft.  Musculoskeletal:        General: Normal range of motion.     Cervical back: Normal range  of motion.  Skin:    General: Skin is warm and  dry.  Neurological:     General: No focal deficit present.     Mental Status: She is alert and oriented to person, place, and time. Mental status is at baseline.  Psychiatric:        Mood and Affect: Mood normal.        Behavior: Behavior normal.     UC Treatments / Results  Labs (all labs ordered are listed, but only abnormal results are displayed) Labs Reviewed - No data to display  EKG   Radiology No results found.  Procedures Procedures (including critical care time)  Medications Ordered in UC Medications - No data to display  Initial Impression / Assessment and Plan / UC Course  I have reviewed the triage vital signs and the nursing notes.  Pertinent labs & imaging results that were available during my care of the patient were reviewed by me and considered in my medical decision making (see chart for details).     Patient presents with symptoms likely from a viral upper respiratory infection. Differential includes bacterial pneumonia, sinusitis, allergic rhinitis, COVID-19, flu. Do not suspect underlying cardiopulmonary process. Symptoms seem unlikely related to ACS, CHF or COPD exacerbations, pneumonia, pneumothorax. Patient is nontoxic appearing and not in need of emergent medical intervention.  Suggested COVID and flu testing but patient declined.  Recommended symptom control with over the counter medications.  It appears the patient may have otitis media with perforation.  There is no obvious perforation on physical exam but there is drainage from the ear indicating that this could be present.  Will treat with doxycycline given patient's current allergy list and inability to prescribe azithromycin given QT prolongation if given in combination with sertraline.  Patient to follow-up with ENT specialist for further evaluation and management given possible perforation of left tympanic membrane.  Provided patient with contact information.  Return if symptoms fail to improve  in 1-2 weeks or you develop shortness of breath, chest pain, severe headache. Patient states understanding and is agreeable.  Discharged with PCP followup.  Final Clinical Impressions(s) / UC Diagnoses   Final diagnoses:  Non-recurrent acute suppurative otitis media of left ear with spontaneous rupture of tympanic membrane  Viral upper respiratory tract infection with cough     Discharge Instructions      You have an ear infection which is being treated with antibiotic.  Please follow-up with provided contact information for ear, nose, throat specialist for further evaluation and management.  Please be sure to not get any water in the ear while it is healing.    ED Prescriptions     Medication Sig Dispense Auth. Provider   azithromycin (ZITHROMAX) 500 MG tablet  (Status: Discontinued) Take 1 tablet (500 mg total) by mouth daily for 1 day, THEN 0.5 tablets (250 mg total) daily for 4 days. 3 tablet [place], [place] E, [place]   doxycycline (VIBRAMYCIN) 100 MG capsule Take 1 capsule (100 mg total) by mouth 2 (two) times daily. 20 capsule Teodora Medici, [place]      PDMP not reviewed this encounter.   Teodora Medici, [place] 05/17/21 [phone]

## 2021-05-17 NOTE — Discharge Instructions (Signed)
You have an ear infection which is being treated with antibiotic.  Please follow-up with provided contact information for ear, nose, throat specialist for further evaluation and management.  Please be sure to not get any water in the ear while it is healing.

## 2021-05-17 NOTE — ED Triage Notes (Signed)
Pt c/o cough, sore throat, nasal congestion, bilat ear ache (left ear is "leaking"), headache,   Denies body aches, chills, nausea, vomiting, diarrhea, constipation  Onset ~ Sunday

## 2021-05-22 DIAGNOSIS — H9190 Unspecified hearing loss, unspecified ear: Secondary | ICD-10-CM | POA: Diagnosis not present

## 2021-05-22 DIAGNOSIS — H9319 Tinnitus, unspecified ear: Secondary | ICD-10-CM | POA: Diagnosis not present

## 2021-05-26 DIAGNOSIS — M25552 Pain in left hip: Secondary | ICD-10-CM | POA: Diagnosis not present

## 2021-05-26 DIAGNOSIS — M545 Low back pain, unspecified: Secondary | ICD-10-CM | POA: Diagnosis not present

## 2021-05-26 DIAGNOSIS — M25551 Pain in right hip: Secondary | ICD-10-CM | POA: Diagnosis not present

## 2021-06-05 DIAGNOSIS — M545 Low back pain, unspecified: Secondary | ICD-10-CM | POA: Diagnosis not present

## 2021-06-07 ENCOUNTER — Encounter: Payer: Self-pay | Admitting: Physical Medicine and Rehabilitation

## 2021-06-07 DIAGNOSIS — M25551 Pain in right hip: Secondary | ICD-10-CM | POA: Diagnosis not present

## 2021-06-07 DIAGNOSIS — M25552 Pain in left hip: Secondary | ICD-10-CM | POA: Diagnosis not present

## 2021-06-13 DIAGNOSIS — H7292 Unspecified perforation of tympanic membrane, left ear: Secondary | ICD-10-CM | POA: Diagnosis not present

## 2021-06-13 DIAGNOSIS — H6692 Otitis media, unspecified, left ear: Secondary | ICD-10-CM | POA: Diagnosis not present

## 2021-06-21 DIAGNOSIS — M545 Low back pain, unspecified: Secondary | ICD-10-CM | POA: Diagnosis not present

## 2021-06-26 DIAGNOSIS — M5416 Radiculopathy, lumbar region: Secondary | ICD-10-CM | POA: Diagnosis not present

## 2021-07-12 DIAGNOSIS — M5416 Radiculopathy, lumbar region: Secondary | ICD-10-CM | POA: Diagnosis not present

## 2021-07-28 ENCOUNTER — Encounter: Payer: Self-pay | Admitting: Neurology

## 2021-07-28 ENCOUNTER — Other Ambulatory Visit: Payer: Self-pay

## 2021-07-28 DIAGNOSIS — R202 Paresthesia of skin: Secondary | ICD-10-CM

## 2021-08-11 IMAGING — MR MR LUMBAR SPINE W/O CM
4 of 5 series · 28 of 48 positions shown · non-contrast
Comparison: Radiography 08/11/2019

CLINICAL DATA: Worsening right-sided low back pain and right leg
weakness. Recent trauma.

EXAM:
MRI LUMBAR SPINE WITHOUT CONTRAST
TECHNIQUE: Multiplanar, multisequence MR imaging of the lumbar spine was
performed. No intravenous contrast was administered.

[Series 5: T2 · sagittal · 4.0mm · 0.73mm/px · 6 of 13 slices shown (1 of 2)]
[im 1/13]
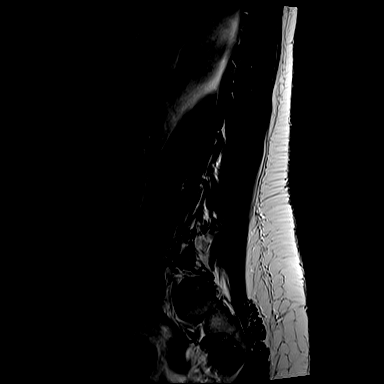
[im 3/13]
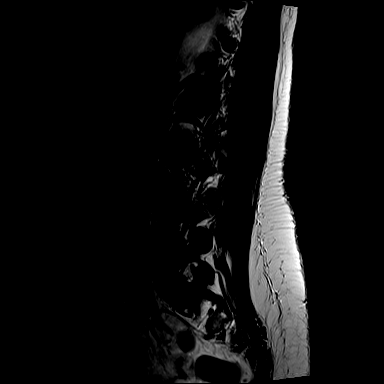
[im 5/13]
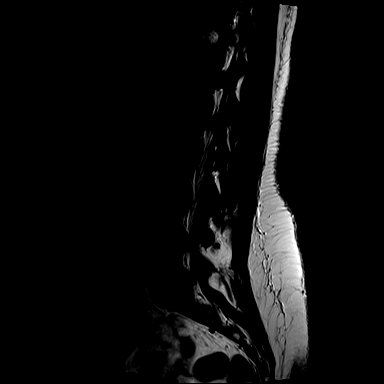
[im 8/13]
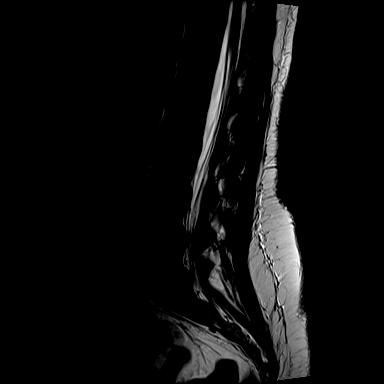
[im 10/13]
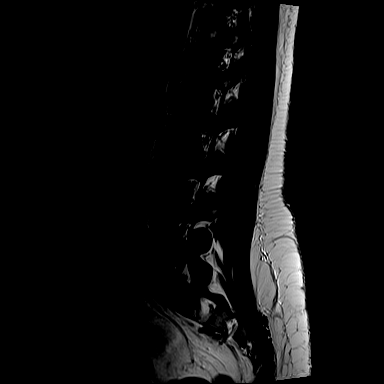
[im 13/13]
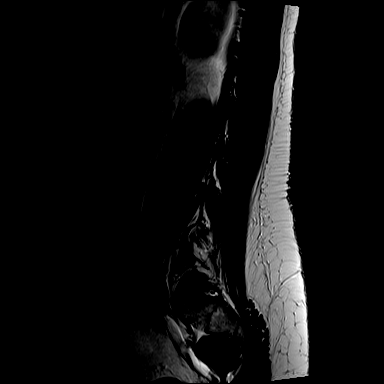

[Series 7: T1 · sagittal · 4.0mm · 0.88mm/px · 6 of 13 slices shown (1 of 2)]
[im 1/13]
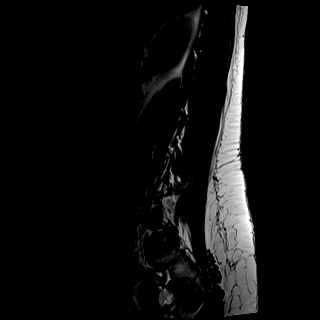
[im 3/13]
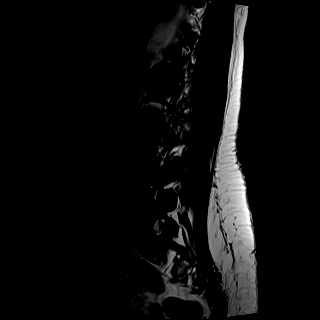
[im 5/13]
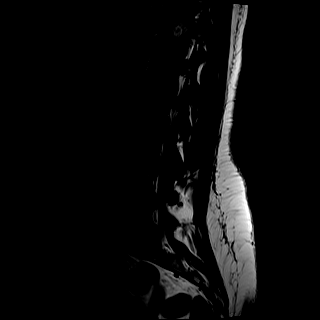
[im 8/13]
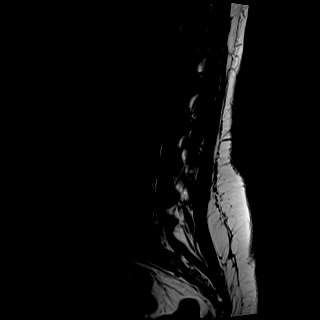
[im 10/13]
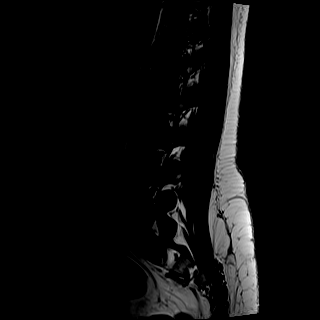
[im 13/13]
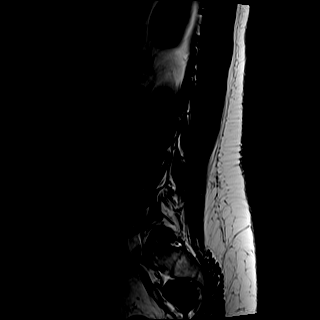

[Series 8: T2 · axial · 4.0mm · 0.57mm/px · z∈[-116,+62]mm · 9 of 31 slices shown (2 of 2)]
[im 1/31]
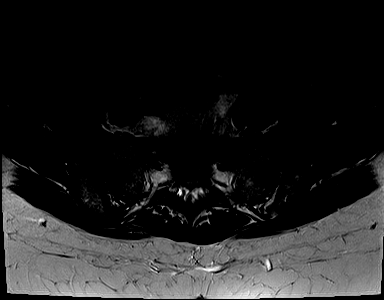
[im 5/31]
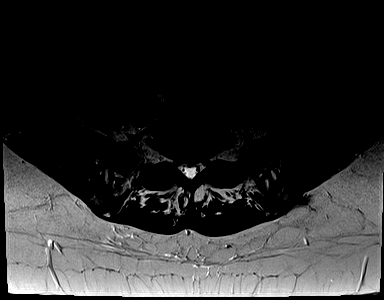
[im 9/31]
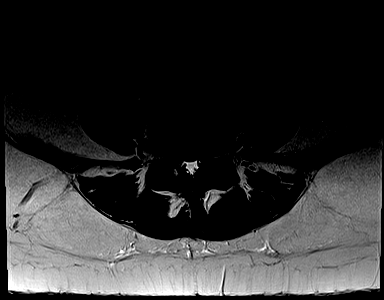
[im 13/31]
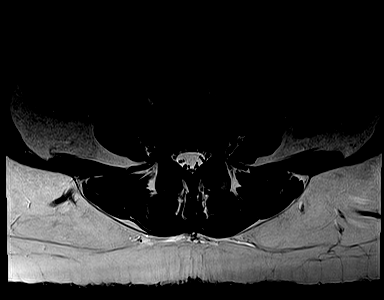
[im 16/31]
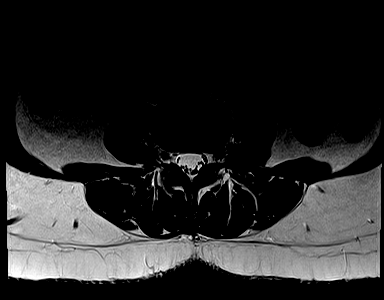
[im 18/31]
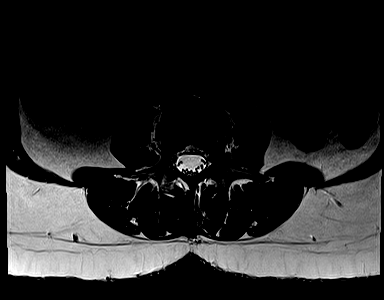
[im 22/31]
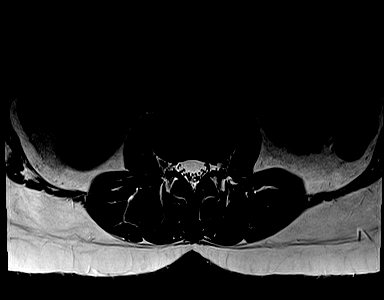
[im 26/31]
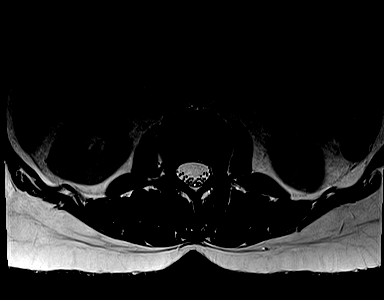
[im 31/31]
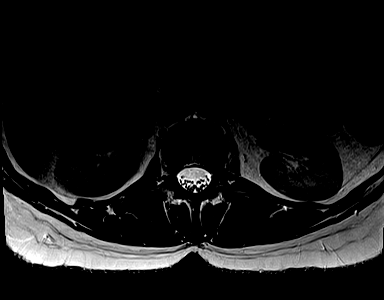

[Series 9: T1 · axial · 4.0mm · 0.34mm/px · z∈[-116,+38]mm · 7 of 31 slices shown (2 of 2)]
[im 1/31]
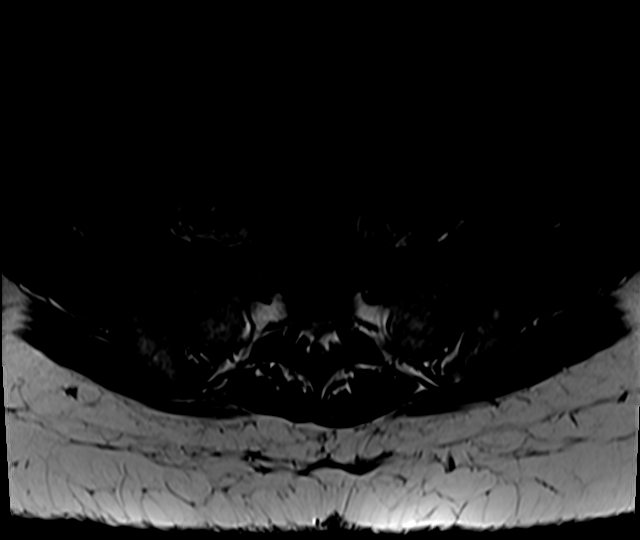
[im 5/31]
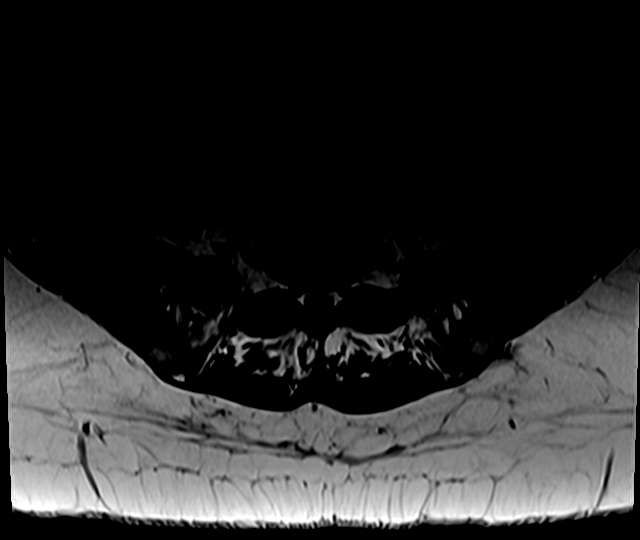
[im 9/31]
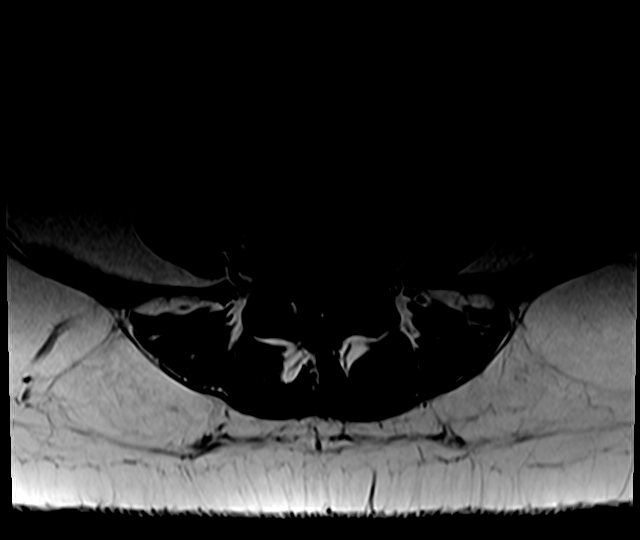
[im 13/31]
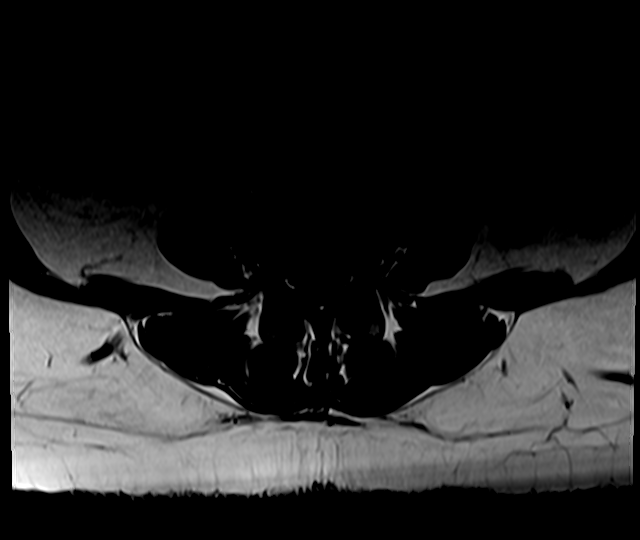
[im 16/31]
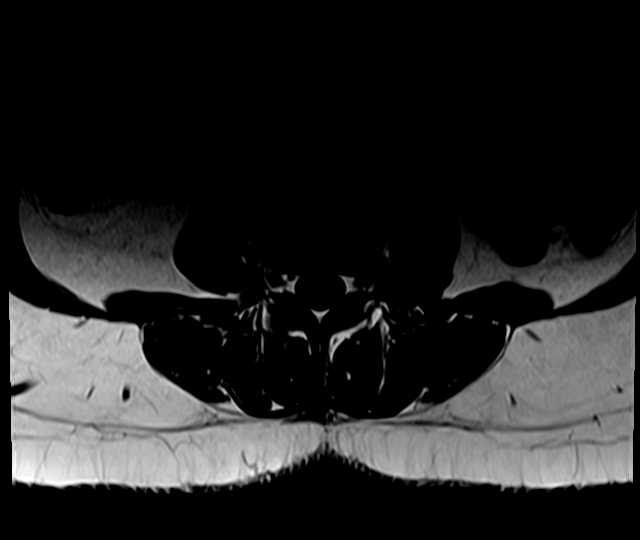
[im 18/31]
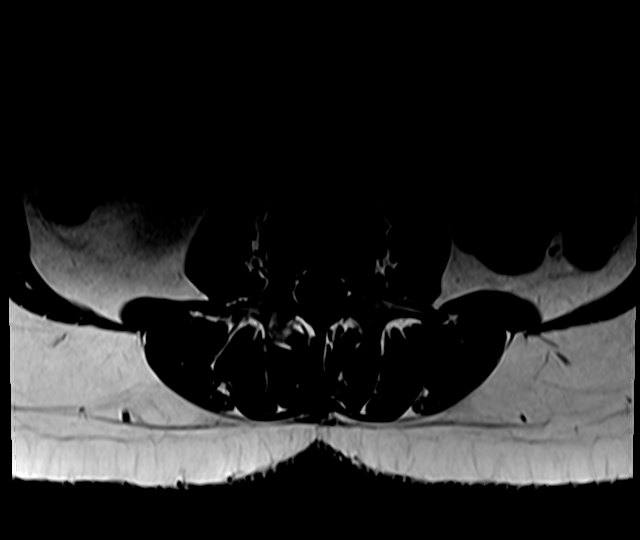
[im 26/31]
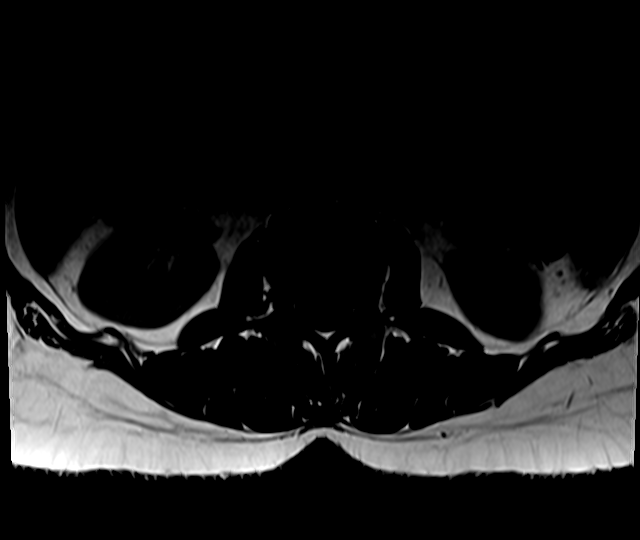

[28 of 48 positions shown; findings below may reference images not displayed]

FINDINGS: Segmentation:  5 lumbar type vertebral bodies.

Alignment:  Normal

Vertebrae:  Normal

Conus medullaris and cauda equina: Conus extends to the L1 level.
Conus and cauda equina appear normal.

Paraspinal and other soft tissues: Normal

Disc levels:

No abnormality at L3-4 or above.

L4-5: Mild disc degeneration with annular bulging slightly more
prominent towards the right. Mild stenosis both lateral recesses.
Some potential that either L5 nerve could be affected.

L5-S1: Normal interspace.
IMPRESSION: Single level abnormality at L4-5 with annular bulging. Mild stenosis
of both lateral recesses that would have potential to affect either
or both L5 nerves.

## 2021-08-15 DIAGNOSIS — M545 Low back pain, unspecified: Secondary | ICD-10-CM | POA: Diagnosis not present

## 2021-08-18 ENCOUNTER — Encounter: Payer: BC Managed Care – PPO | Admitting: Physical Medicine & Rehabilitation

## 2021-08-30 DIAGNOSIS — M5416 Radiculopathy, lumbar region: Secondary | ICD-10-CM | POA: Diagnosis not present

## 2021-09-05 DIAGNOSIS — M5416 Radiculopathy, lumbar region: Secondary | ICD-10-CM | POA: Diagnosis not present

## 2021-09-12 ENCOUNTER — Encounter
Payer: BC Managed Care – PPO | Attending: Physical Medicine & Rehabilitation | Admitting: Physical Medicine & Rehabilitation

## 2021-09-12 ENCOUNTER — Encounter: Payer: Self-pay | Admitting: Physical Medicine & Rehabilitation

## 2021-09-12 VITALS — BP 144/84 | HR 73 | Ht 61.0 in | Wt 194.0 lb

## 2021-09-12 DIAGNOSIS — M5136 Other intervertebral disc degeneration, lumbar region: Secondary | ICD-10-CM | POA: Insufficient documentation

## 2021-09-12 DIAGNOSIS — M5416 Radiculopathy, lumbar region: Secondary | ICD-10-CM | POA: Diagnosis not present

## 2021-09-12 DIAGNOSIS — M48061 Spinal stenosis, lumbar region without neurogenic claudication: Secondary | ICD-10-CM | POA: Insufficient documentation

## 2021-09-12 MED ORDER — DULOXETINE HCL 30 MG PO CPEP: 30.0000 mg | ORAL_CAPSULE | Freq: Every day | ORAL | 2 refills | Status: DC

## 2021-09-12 MED ORDER — LIDOCAINE 5 % EX PTCH: 1.0000 | MEDICATED_PATCH | CUTANEOUS | Status: AC

## 2021-09-12 NOTE — Progress Notes (Unsigned)
Subjective:    Patient ID: [name], female    DOB: Jan 26, 1971, 51 y.o.   MRN: [id number]  HPI  [name] is a 51 year old female with past medical history of low back pain and migraine who is here for evaluation of chronic pain.  She reports that the pain in her back began around 2020 when she was walking a lot.  She initially ignored the pain, but says around April 2021 went to see Dr. Ninfa Linden orthopedics.  She reports she was started with physical therapy and an MRI was ordered.  She ended up being hospitalized due to the severe pain.  She had an MRI that showed disc bulging L4-5 and lateral recess stenosis mild that could affect both L5 nerves.  She completed PT with minimal benefit she reports she had Lyrica and gabapentin and Celebrex without significant improvement.  She used Voltaren without benefit.  Lidocaine patches did help her lower back pain.  She does use ibuprofen with some benefit.  The pain is worsened by any activity.  She has aching pain in her right lower back in addition to shooting pain down her leg and through her calf and to the dorsum of her foot.  She had multiple ESI's by Dr. Ernestina Patches.  The first injection provided moderate relief but then reports she had decreased improvement after each subsequent injection no weakness numbness or bowel or bladder changes.  She has a family history of fibromyalgia.  She reports she will follow-up with a neurosurgeon Dr. Venora Maples with [facility] next month with plans for a neurosurgical procedure.  She also has a repeat lumbar MRI ordered.  She reports her mood is good overall, she does take an antidepressant sertraline 25 mg daily.  No SI or HI      Pain Inventory Average Pain 10 Pain Right Now 4 My pain is constant, sharp, dull, stabbing, and aching  In the last 24 hours, has pain interfered with the following? General activity 9 Relation with others 0 Enjoyment of life 7 What TIME of day is your pain  at its worst? evening Sleep (in general) Good  Pain is worse with: walking, bending, standing, and some activites Pain improves with: rest and medication Relief from Meds: 4  how many minutes can you walk? 30 minutes ability to climb steps?  yes do you drive?  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  yes x-rays, Dr. Jonni Sanger  Any changes since last visit?  no    Family History  Problem Relation Age of Onset   Kidney cancer Maternal Grandfather    Breast cancer Paternal Grandmother    Kidney cancer Maternal Aunt    Colon polyps Neg Hx    Colon cancer Neg Hx    Esophageal cancer Neg Hx    Rectal cancer Neg Hx    Stomach cancer Neg Hx    Social History   Socioeconomic History   Marital status: Married    Spouse name: Not on file   Number of children: Not on file   Years of education: Not on file   Highest education level: Not on file  Occupational History   Not on file  Tobacco Use   Smoking status: Former   Smokeless tobacco: Never  Vaping Use   Vaping Use: Never used  Substance and Sexual Activity   Alcohol use: Yes    Comment: social- rare  Drug use: Never   Sexual activity: Not on file  Other Topics Concern   Not on file  Social History Narrative   Not on file   Social Determinants of Health   Financial Resource Strain: Not on file  Food Insecurity: Not on file  Transportation Needs: Not on file  Physical Activity: Not on file  Stress: Not on file  Social Connections: Not on file   Past Surgical History:  Procedure Laterality Date   APPENDECTOMY     CESAREAN SECTION     x1   CHOLECYSTECTOMY     FOOT SURGERY     LASIK     NOSE SURGERY     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    TONSILLECTOMY     TYMPANOSTOMY TUBE PLACEMENT     Past Medical History:  Diagnosis Date   Allergy    Anxiety    BP (!) 144/84   Pulse 73   Ht '5\' 1"'$  (1.549 m)   Wt 194 lb (88  kg)   SpO2 96%   BMI 36.66 kg/m   Opioid Risk Score:   Fall Risk Score:  `1  Depression screen [facility] 2/9     09/12/2021   11:30 AM  Depression screen PHQ 2/9  Decreased Interest 0  Down, Depressed, Hopeless 0  PHQ - 2 Score 0  Altered sleeping 0  Tired, decreased energy 0  Change in appetite 0  Feeling bad or failure about yourself  0  Trouble concentrating 0  Moving slowly or fidgety/restless 0  Suicidal thoughts 0  PHQ-9 Score 0    Review of Systems  Musculoskeletal:  Positive for back pain.       Right buttock & down right leg pain  All other systems reviewed and are negative.     Objective:   Physical Exam Gen: no distress, normal appearing HEENT: oral mucosa pink and moist, NCAT Cardio: Reg rate Chest: normal effort, normal rate of breathing Abd: soft, non-distended Ext: no edema Psych: pleasant, normal affect Skin: intact Neuro: Alert and oriented x4, speech normal, follows commands and answers questions, sensation to light touch intact in all 4 extremities, Hoffmann's positive bilaterally, deep tendon reflexes brisk throughout 3+ symmetrical at bilateral biceps brachioradialis triceps patella Achilles.  Coordination normal Musculoskeletal: Strength 5 out of 5 in all 4 extremities, normal tone and range of motion, slump negative bilaterally, SLR positive on the right, fair and FABER equivocal on the right negative on the left, negative Spurling's, no ankle clonus, tenderness of the paraspinals and QL on the right.   L spine mir 09/19/19 Segmentation:  5 lumbar type vertebral bodies.   Alignment:  Normal   Vertebrae:  Normal   Conus medullaris and cauda equina: Conus extends to the L1 level. Conus and cauda equina appear normal.   Paraspinal and other soft tissues: Normal   Disc levels:   No abnormality at L3-4 or above.   L4-5: Mild disc degeneration with annular bulging slightly more prominent towards the right. Mild stenosis both lateral  recesses. Some potential that either L5 nerve could be affected.   L5-S1: Normal interspace.   IMPRESSION: Single level abnormality at L4-5 with annular bulging. Mild stenosis of both lateral recesses that would have potential to affect either or both L5 nerves.    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  This may provide benefit for both pain and mood.   -Patient reports she would like to avoid opioid medications long-term -We will order lidocaine patches

## 2021-09-13 ENCOUNTER — Other Ambulatory Visit: Payer: Self-pay | Admitting: Orthopedic Surgery

## 2021-09-14 ENCOUNTER — Encounter: Payer: Self-pay | Admitting: Physical Medicine & Rehabilitation

## 2021-09-15 DIAGNOSIS — M545 Low back pain, unspecified: Secondary | ICD-10-CM | POA: Diagnosis not present

## 2021-09-26 DIAGNOSIS — H52201 Unspecified astigmatism, right eye: Secondary | ICD-10-CM | POA: Diagnosis not present

## 2021-09-26 DIAGNOSIS — H33103 Unspecified retinoschisis, bilateral: Secondary | ICD-10-CM | POA: Diagnosis not present

## 2021-10-02 ENCOUNTER — Ambulatory Visit: Payer: BC Managed Care – PPO | Admitting: Physical Medicine and Rehabilitation

## 2021-10-03 NOTE — Progress Notes (Signed)
Surgical Instructions    Your procedure is scheduled on Thursday June 29.  Report to [facility] Main Entrance "A" at 9:30 A.M., then check in with the Admitting office.  Call this number if you have problems the morning of surgery:  [phone]   If you have any questions prior to your surgery date call [phone]: Open Monday-Friday 8am-4pm    Remember:  Do not eat after midnight the night before your surgery  You may drink clear liquids until 8:30am the morning of your surgery.   Clear liquids allowed are: Water, Non-Citrus Juices (without pulp), Carbonated Beverages, Clear Tea, Black Coffee ONLY (NO MILK, CREAM OR POWDERED CREAMER of any kind), and Gatorade  The night before surgery:  No food after midnight. ONLY clear liquids after midnight  The day of surgery (if you do NOT have diabetes):  Drink ONE (1) Pre-Surgery Clear Ensure by 8:30am the morning of surgery. Drink in one sitting. Do not sip.  This drink was given to you during your hospital  pre-op appointment visit.  Nothing else to drink after completing the  Pre-Surgery Clear Ensure.        If you have questions, please contact your surgeon's office.      Take these medicines the morning of surgery with A SIP OF WATER:  DULoxetine (CYMBALTA) loratadine (CLARITIN)  As of today, STOP taking any Aspirin (unless otherwise instructed by your surgeon) Aleve, Naproxen, Ibuprofen, Motrin, Advil, Goody's, BC's, all herbal medications, fish oil, and all vitamins.           Do not wear jewelry or makeup Do not wear lotions, powders, perfumes/colognes, or deodorant. Do not shave 48 hours prior to surgery.  Men may shave face and neck. Do not bring valuables to the hospital. Do not wear nail polish, gel polish, artificial nails, or any other type of covering on natural nails (fingers and toes) If you have artificial nails or gel coating that need to be removed by a nail salon, please have this removed prior to surgery.  Artificial nails or gel coating may interfere with anesthesia's ability to adequately monitor your vital signs.  [facility] is not responsible for any belongings or valuables. .   Do NOT Smoke (Tobacco/Vaping)  24 hours prior to your procedure  If you use a CPAP at night, you may bring your mask for your overnight stay.   Contacts, glasses, hearing aids, dentures or partials may not be worn into surgery, please bring cases for these belongings   For patients admitted to the hospital, discharge time will be determined by your treatment team.   Patients discharged the day of surgery will not be allowed to drive home, and someone needs to stay with them for 24 hours.   SURGICAL WAITING ROOM VISITATION Patients having surgery or a procedure in a hospital may have two support people. Children under the age of 28 must have an adult with them who is not the patient. They may stay in the waiting area during the procedure and may switch out with other visitors. If the patient needs to stay at the hospital during part of their recovery, the visitor guidelines for inpatient rooms apply.  Please refer to the [facility] website for the visitor guidelines for Inpatients (after your surgery is over and you are in a regular room).       Special instructions:    Oral Hygiene is also important to reduce your risk of infection.  Remember - BRUSH YOUR TEETH THE  MORNING OF SURGERY WITH YOUR REGULAR TOOTHPASTE   [facility]- Preparing For Surgery  Before surgery, you can play an important role. Because skin is not sterile, your skin needs to be as free of germs as possible. You can reduce the number of germs on your skin by washing with CHG (chlorahexidine gluconate) Soap before surgery.  CHG is an antiseptic cleaner which kills germs and bonds with the skin to continue killing germs even after washing.     Please do not use if you have an allergy to CHG or antibacterial soaps. If your skin becomes  reddened/irritated stop using the CHG.  Do not shave (including legs and underarms) for at least 48 hours prior to first CHG shower. It is OK to shave your face.  Please follow these instructions carefully.     Shower the NIGHT BEFORE SURGERY and the MORNING OF SURGERY with CHG Soap.   If you chose to wash your hair, wash your hair first as usual with your normal shampoo. After you shampoo, rinse your hair and body thoroughly to remove the shampoo.  Then [employer] and genitals (private parts) with your normal soap and rinse thoroughly to remove soap.  After that Use CHG Soap as you would any other liquid soap. You can apply CHG directly to the skin and wash gently with a scrungie or a clean washcloth.   Apply the CHG Soap to your body ONLY FROM THE NECK DOWN.  Do not use on open wounds or open sores. Avoid contact with your eyes, ears, mouth and genitals (private parts). Wash Face and genitals (private parts)  with your normal soap.   Wash thoroughly, paying special attention to the area where your surgery will be performed.  Thoroughly rinse your body with warm water from the neck down.  DO NOT shower/wash with your normal soap after using and rinsing off the CHG Soap.  Pat yourself dry with a CLEAN TOWEL.  Wear CLEAN PAJAMAS to bed the night before surgery  Place CLEAN SHEETS on your bed the night before your surgery  DO NOT SLEEP WITH PETS.   Day of Surgery:  Take a shower with CHG soap. Wear Clean/Comfortable clothing the morning of surgery Do not apply any deodorants/lotions.   Remember to brush your teeth WITH YOUR REGULAR TOOTHPASTE.    If you received a COVID test during your pre-op visit, it is requested that you wear a mask when out in public, stay away from anyone that may not be feeling well, and notify your surgeon if you develop symptoms. If you have been in contact with anyone that has tested positive in the last 10 days, please notify your surgeon.    Please  read over the following fact sheets that you were given.

## 2021-10-04 ENCOUNTER — Other Ambulatory Visit: Payer: Self-pay | Admitting: Physical Medicine & Rehabilitation

## 2021-10-04 ENCOUNTER — Other Ambulatory Visit: Payer: Self-pay

## 2021-10-04 ENCOUNTER — Encounter (HOSPITAL_COMMUNITY)
Admission: RE | Admit: 2021-10-04 | Discharge: 2021-10-04 | Disposition: A | Discharge status: Home / Self Care | Payer: BC Managed Care – PPO | Source: Ambulatory Visit | Attending: Orthopedic Surgery | Admitting: Orthopedic Surgery

## 2021-10-04 ENCOUNTER — Encounter (HOSPITAL_COMMUNITY): Payer: Self-pay

## 2021-10-04 VITALS — BP 141/84 | HR 79 | Temp 98.1°F | Resp 17 | Ht 61.0 in | Wt 192.7 lb

## 2021-10-04 DIAGNOSIS — Z01812 Encounter for preprocedural laboratory examination: Secondary | ICD-10-CM | POA: Insufficient documentation

## 2021-10-04 DIAGNOSIS — Z01818 Encounter for other preprocedural examination: Secondary | ICD-10-CM

## 2021-10-04 LAB — SURGICAL PCR SCREEN
MRSA, PCR: NEGATIVE
Staphylococcus aureus: NEGATIVE

## 2021-10-04 LAB — CBC
HCT: 44.6 % (ref 36.0–46.0)
Hemoglobin: 15 g/dL (ref 12.0–15.0)
MCH: 29 pg (ref 26.0–34.0)
MCHC: 33.6 g/dL (ref 30.0–36.0)
MCV: 86.3 fL (ref 80.0–100.0)
Platelets: 267 10*3/uL (ref 150–400)
RBC: 5.17 MIL/uL — ABNORMAL HIGH (ref 3.87–5.11)
RDW: 13.7 % (ref 11.5–15.5)
WBC: 8.3 10*3/uL (ref 4.0–10.5)
nRBC: 0 % (ref 0.0–0.2)

## 2021-10-04 NOTE — Progress Notes (Signed)
PCP - Mayra Neer MD Cardiologist - denies  PPM/ICD - denies Device Orders -  Rep Notified -   Chest x-ray - na EKG - none Stress Test - none- ECHO - none Cardiac Cath - none  Sleep Study - denies CPAP -   Fasting Blood Sugar - na Checks Blood Sugar _____ times a day  Blood Thinner Instructions:na Aspirin Instructions:na   ERAS Protcol -clear liquids until 0830 PRE-SURGERY Ensure or G2- Ensure  COVID TEST- na   Anesthesia review: no  Patient denies shortness of breath, fever, cough and chest pain at PAT appointment   All instructions explained to the patient, with a verbal understanding of the material. Patient agrees to go over the instructions while at home for a better understanding. Patient also instructed to wear a mask when out in public prior to surgery. The opportunity to ask questions was provided.

## 2021-10-10 DIAGNOSIS — M5416 Radiculopathy, lumbar region: Secondary | ICD-10-CM | POA: Diagnosis not present

## 2021-10-12 ENCOUNTER — Ambulatory Visit (HOSPITAL_COMMUNITY)
Admission: RE | Admit: 2021-10-12 | Discharge: 2021-10-12 | Disposition: A | Discharge status: Home / Self Care | Payer: BC Managed Care – PPO | Attending: Orthopedic Surgery | Admitting: Orthopedic Surgery

## 2021-10-12 ENCOUNTER — Ambulatory Visit (HOSPITAL_COMMUNITY)
Admission: RE | Disposition: A | Discharge status: Home / Self Care | Payer: Self-pay | Source: Home / Self Care | Attending: Orthopedic Surgery

## 2021-10-12 ENCOUNTER — Other Ambulatory Visit: Payer: Self-pay

## 2021-10-12 ENCOUNTER — Ambulatory Visit (HOSPITAL_COMMUNITY): Payer: BC Managed Care – PPO | Admitting: Anesthesiology

## 2021-10-12 ENCOUNTER — Ambulatory Visit (HOSPITAL_COMMUNITY): Payer: BC Managed Care – PPO

## 2021-10-12 DIAGNOSIS — M5116 Intervertebral disc disorders with radiculopathy, lumbar region: Secondary | ICD-10-CM | POA: Insufficient documentation

## 2021-10-12 DIAGNOSIS — Z87891 Personal history of nicotine dependence: Secondary | ICD-10-CM | POA: Insufficient documentation

## 2021-10-12 DIAGNOSIS — Z01818 Encounter for other preprocedural examination: Secondary | ICD-10-CM

## 2021-10-12 DIAGNOSIS — M5416 Radiculopathy, lumbar region: Secondary | ICD-10-CM

## 2021-10-12 HISTORY — PX: LUMBAR LAMINECTOMY/DECOMPRESSION MICRODISCECTOMY: SHX5026

## 2021-10-12 LAB — POCT PREGNANCY, URINE: Preg Test, Ur: NEGATIVE

## 2021-10-12 SURGERY — LUMBAR LAMINECTOMY/DECOMPRESSION MICRODISCECTOMY
Anesthesia: General | Laterality: Right

## 2021-10-12 MED ORDER — DEXAMETHASONE SODIUM PHOSPHATE 10 MG/ML IJ SOLN
INTRAMUSCULAR | Status: DC | PRN
  Administered 2021-10-12: 10 mg via INTRAVENOUS

## 2021-10-12 MED ORDER — FENTANYL CITRATE (PF) 250 MCG/5ML IJ SOLN
INTRAMUSCULAR | Status: DC | PRN
  Administered 2021-10-12: 100 ug via INTRAVENOUS
  Administered 2021-10-12 (×3): 50 ug via INTRAVENOUS

## 2021-10-12 MED ORDER — ORAL CARE MOUTH RINSE: 15.0000 mL | Freq: Once | OROMUCOSAL | Status: AC

## 2021-10-12 MED ORDER — FENTANYL CITRATE (PF) 250 MCG/5ML IJ SOLN
INTRAMUSCULAR | Status: AC
  Filled 2021-10-12: qty 5

## 2021-10-12 MED ORDER — METHOCARBAMOL 750 MG PO TABS: 750.0000 mg | ORAL_TABLET | Freq: Four times a day (QID) | ORAL | 0 refills | Status: DC | PRN

## 2021-10-12 MED ORDER — THROMBIN (RECOMBINANT) 20000 UNITS EX SOLR
CUTANEOUS | Status: DC | PRN
  Administered 2021-10-12: 20000 [IU] via TOPICAL

## 2021-10-12 MED ORDER — POVIDONE-IODINE 7.5 % EX SOLN
Freq: Once | CUTANEOUS | Status: AC
  Filled 2021-10-12: qty 118

## 2021-10-12 MED ORDER — LACTATED RINGERS IV SOLN: INTRAVENOUS | Status: DC

## 2021-10-12 MED ORDER — ONDANSETRON HCL 4 MG/2ML IJ SOLN: 4.0000 mg | Freq: Once | INTRAMUSCULAR | Status: DC | PRN

## 2021-10-12 MED ORDER — MIDAZOLAM HCL 2 MG/2ML IJ SOLN
INTRAMUSCULAR | Status: DC | PRN
  Administered 2021-10-12: 2 mg via INTRAVENOUS

## 2021-10-12 MED ORDER — PROPOFOL 10 MG/ML IV BOLUS
INTRAVENOUS | Status: DC | PRN
  Administered 2021-10-12: 160 mg via INTRAVENOUS
  Administered 2021-10-12: 40 mg via INTRAVENOUS

## 2021-10-12 MED ORDER — BUPIVACAINE-EPINEPHRINE 0.25% -1:200000 IJ SOLN
INTRAMUSCULAR | Status: DC | PRN
  Administered 2021-10-12: 3 mL
  Administered 2021-10-12: 20 mL

## 2021-10-12 MED ORDER — BUPIVACAINE LIPOSOME 1.3 % IJ SUSP
INTRAMUSCULAR | Status: AC
Start: 2021-10-12 — End: ?
  Filled 2021-10-12: qty 20

## 2021-10-12 MED ORDER — ROCURONIUM BROMIDE 10 MG/ML (PF) SYRINGE
PREFILLED_SYRINGE | INTRAVENOUS | Status: DC | PRN
  Administered 2021-10-12: 20 mg via INTRAVENOUS
  Administered 2021-10-12: 60 mg via INTRAVENOUS

## 2021-10-12 MED ORDER — BUPIVACAINE LIPOSOME 1.3 % IJ SUSP
INTRAMUSCULAR | Status: DC | PRN
  Administered 2021-10-12: 20 mL

## 2021-10-12 MED ORDER — HYDROCODONE-ACETAMINOPHEN 5-325 MG PO TABS: 1.0000 | ORAL_TABLET | Freq: Four times a day (QID) | ORAL | 0 refills | Status: AC | PRN

## 2021-10-12 MED ORDER — 0.9 % SODIUM CHLORIDE (POUR BTL) OPTIME
TOPICAL | Status: DC | PRN
  Administered 2021-10-12: 1000 mL

## 2021-10-12 MED ORDER — BUPIVACAINE-EPINEPHRINE (PF) 0.25% -1:200000 IJ SOLN
INTRAMUSCULAR | Status: AC
  Filled 2021-10-12: qty 30

## 2021-10-12 MED ORDER — HYDROMORPHONE HCL 1 MG/ML IJ SOLN
0.2500 mg | INTRAMUSCULAR | Status: DC | PRN
  Administered 2021-10-12 (×2): 0.5 mg via INTRAVENOUS

## 2021-10-12 MED ORDER — LIDOCAINE 2% (20 MG/ML) 5 ML SYRINGE
INTRAMUSCULAR | Status: DC | PRN
  Administered 2021-10-12: 80 mg via INTRAVENOUS

## 2021-10-12 MED ORDER — ONDANSETRON HCL 4 MG/2ML IJ SOLN
INTRAMUSCULAR | Status: DC | PRN
  Administered 2021-10-12: 4 mg via INTRAVENOUS

## 2021-10-12 MED ORDER — VANCOMYCIN HCL IN DEXTROSE 1-5 GM/200ML-% IV SOLN
1000.0000 mg | INTRAVENOUS | Status: AC
  Administered 2021-10-12: 1000 mg via INTRAVENOUS
  Filled 2021-10-12: qty 200

## 2021-10-12 MED ORDER — THROMBIN (RECOMBINANT) 20000 UNITS EX SOLR
CUTANEOUS | Status: AC
Start: 2021-10-12 — End: ?
  Filled 2021-10-12: qty 20000

## 2021-10-12 MED ORDER — DEXMEDETOMIDINE (PRECEDEX) IN NS 20 MCG/5ML (4 MCG/ML) IV SYRINGE
PREFILLED_SYRINGE | INTRAVENOUS | Status: DC | PRN
  Administered 2021-10-12: 8 ug via INTRAVENOUS
  Administered 2021-10-12: 4 ug via INTRAVENOUS
  Administered 2021-10-12: 8 ug via INTRAVENOUS

## 2021-10-12 MED ORDER — PHENYLEPHRINE 80 MCG/ML (10ML) SYRINGE FOR IV PUSH (FOR BLOOD PRESSURE SUPPORT)
PREFILLED_SYRINGE | INTRAVENOUS | Status: DC | PRN
  Administered 2021-10-12: 80 ug via INTRAVENOUS
  Administered 2021-10-12: 120 ug via INTRAVENOUS

## 2021-10-12 MED ORDER — METHYLPREDNISOLONE ACETATE 40 MG/ML IJ SUSP
INTRAMUSCULAR | Status: AC
  Filled 2021-10-12: qty 1

## 2021-10-12 MED ORDER — HYDROMORPHONE HCL 1 MG/ML IJ SOLN
INTRAMUSCULAR | Status: AC
  Filled 2021-10-12: qty 1

## 2021-10-12 MED ORDER — KETOROLAC TROMETHAMINE 30 MG/ML IJ SOLN
30.0000 mg | Freq: Once | INTRAMUSCULAR | Status: AC | PRN
  Administered 2021-10-12: 30 mg via INTRAVENOUS

## 2021-10-12 MED ORDER — METHYLPREDNISOLONE ACETATE 40 MG/ML IJ SUSP
INTRAMUSCULAR | Status: DC | PRN
  Administered 2021-10-12: 40 mg

## 2021-10-12 MED ORDER — PHENYLEPHRINE HCL-NACL 20-0.9 MG/250ML-% IV SOLN
INTRAVENOUS | Status: DC | PRN
  Administered 2021-10-12: 75 ug/min via INTRAVENOUS

## 2021-10-12 MED ORDER — KETOROLAC TROMETHAMINE 30 MG/ML IJ SOLN
INTRAMUSCULAR | Status: AC
  Filled 2021-10-12: qty 1

## 2021-10-12 MED ORDER — HEMOSTATIC AGENTS (NO CHARGE) OPTIME
TOPICAL | Status: DC | PRN
  Administered 2021-10-12: 1

## 2021-10-12 MED ORDER — CHLORHEXIDINE GLUCONATE 0.12 % MT SOLN
15.0000 mL | Freq: Once | OROMUCOSAL | Status: AC
  Administered 2021-10-12: 15 mL via OROMUCOSAL
  Filled 2021-10-12: qty 15

## 2021-10-12 MED ORDER — MIDAZOLAM HCL 2 MG/2ML IJ SOLN
INTRAMUSCULAR | Status: AC
  Filled 2021-10-12: qty 2

## 2021-10-12 SURGICAL SUPPLY — 71 items
AGENT HMST KT MTR STRL THRMB (HEMOSTASIS)
APL SKNCLS STERI-STRIP NONHPOA (GAUZE/BANDAGES/DRESSINGS) ×1
BAG COUNTER SPONGE SURGICOUNT (BAG) ×2 IMPLANT
BAG SPNG CNTER NS LX DISP (BAG) ×1
BENZOIN TINCTURE PRP APPL 2/3 (GAUZE/BANDAGES/DRESSINGS) ×1 IMPLANT
BUR ROUND PRECISION 4.0 (BURR) ×2 IMPLANT
CABLE BIPOLOR RESECTION CORD (MISCELLANEOUS) ×2 IMPLANT
CANISTER SUCT 3000ML PPV (MISCELLANEOUS) ×2 IMPLANT
CARTRIDGE OIL MAESTRO DRILL (MISCELLANEOUS) ×1 IMPLANT
COVER SURGICAL LIGHT HANDLE (MISCELLANEOUS) ×2 IMPLANT
DIFFUSER DRILL AIR PNEUMATIC (MISCELLANEOUS) ×2 IMPLANT
DRAIN CHANNEL 15F RND FF W/TCR (WOUND CARE) IMPLANT
DRAPE POUCH INSTRU U-SHP 10X18 (DRAPES) ×4 IMPLANT
DRAPE SURG 17X23 STRL (DRAPES) ×8 IMPLANT
DURAPREP 26ML APPLICATOR (WOUND CARE) ×2 IMPLANT
ELECT BLADE 4.0 EZ CLEAN MEGAD (MISCELLANEOUS) ×2
ELECT CAUTERY BLADE 6.4 (BLADE) ×2 IMPLANT
ELECT REM PT RETURN 9FT ADLT (ELECTROSURGICAL) ×2
ELECTRODE BLDE 4.0 EZ CLN MEGD (MISCELLANEOUS) ×1 IMPLANT
ELECTRODE REM PT RTRN 9FT ADLT (ELECTROSURGICAL) ×1 IMPLANT
EVACUATOR SILICONE 100CC (DRAIN) IMPLANT
FILTER STRAW FLUID ASPIR (MISCELLANEOUS) ×2 IMPLANT
GAUZE 4X4 16PLY ~~LOC~~+RFID DBL (SPONGE) ×4 IMPLANT
GAUZE SPONGE 4X4 12PLY STRL (GAUZE/BANDAGES/DRESSINGS) ×2 IMPLANT
GLOVE BIO SURGEON STRL SZ7 (GLOVE) ×2 IMPLANT
GLOVE BIO SURGEON STRL SZ8 (GLOVE) ×2 IMPLANT
GLOVE BIOGEL PI IND STRL 7.0 (GLOVE) ×1 IMPLANT
GLOVE BIOGEL PI IND STRL 8 (GLOVE) ×1 IMPLANT
GLOVE BIOGEL PI INDICATOR 7.0 (GLOVE) ×1
GLOVE BIOGEL PI INDICATOR 8 (GLOVE) ×1
GLOVE SURG ENC MOIS LTX SZ6.5 (GLOVE) ×2 IMPLANT
GOWN STRL REUS W/ TWL LRG LVL3 (GOWN DISPOSABLE) ×1 IMPLANT
GOWN STRL REUS W/ TWL XL LVL3 (GOWN DISPOSABLE) ×2 IMPLANT
GOWN STRL REUS W/TWL LRG LVL3 (GOWN DISPOSABLE) ×2
GOWN STRL REUS W/TWL XL LVL3 (GOWN DISPOSABLE) ×4
IV CATH 14GX2 1/4 (CATHETERS) ×2 IMPLANT
KIT BASIN OR (CUSTOM PROCEDURE TRAY) ×2 IMPLANT
KIT POSITION SURG JACKSON T1 (MISCELLANEOUS) ×2 IMPLANT
KIT TURNOVER KIT B (KITS) ×2 IMPLANT
MARKER SKIN DUAL TIP RULER LAB (MISCELLANEOUS) ×2 IMPLANT
NDL 18GX1X1/2 (RX/OR ONLY) (NEEDLE) ×1 IMPLANT
NDL HYPO 25GX1X1/2 BEV (NEEDLE) ×1 IMPLANT
NDL SPNL 18GX3.5 QUINCKE PK (NEEDLE) ×2 IMPLANT
NEEDLE 18GX1X1/2 (RX/OR ONLY) (NEEDLE) ×2 IMPLANT
NEEDLE 22X1 1/2 (OR ONLY) (NEEDLE) ×2 IMPLANT
NEEDLE HYPO 25GX1X1/2 BEV (NEEDLE) ×2 IMPLANT
NEEDLE SPNL 18GX3.5 QUINCKE PK (NEEDLE) ×4 IMPLANT
NS IRRIG 1000ML POUR BTL (IV SOLUTION) ×2 IMPLANT
OIL CARTRIDGE MAESTRO DRILL (MISCELLANEOUS) ×2
PACK LAMINECTOMY ORTHO (CUSTOM PROCEDURE TRAY) ×2 IMPLANT
PACK UNIVERSAL I (CUSTOM PROCEDURE TRAY) ×2 IMPLANT
PAD ARMBOARD 7.5X6 YLW CONV (MISCELLANEOUS) ×4 IMPLANT
PATTIES SURGICAL .5 X.5 (GAUZE/BANDAGES/DRESSINGS) ×1 IMPLANT
SPONGE INTESTINAL PEANUT (DISPOSABLE) ×2 IMPLANT
SPONGE SURGIFOAM ABS GEL SZ50 (HEMOSTASIS) ×2 IMPLANT
STRIP CLOSURE SKIN 1/2X4 (GAUZE/BANDAGES/DRESSINGS) ×1 IMPLANT
SURGIFLO W/THROMBIN 8M KIT (HEMOSTASIS) IMPLANT
SUT MNCRL AB 4-0 PS2 18 (SUTURE) ×2 IMPLANT
SUT VIC AB 0 CT1 18XCR BRD 8 (SUTURE) IMPLANT
SUT VIC AB 0 CT1 8-18 (SUTURE)
SUT VIC AB 1 CT1 18XCR BRD 8 (SUTURE) ×1 IMPLANT
SUT VIC AB 1 CT1 8-18 (SUTURE) ×2
SUT VIC AB 2-0 CT2 18 VCP726D (SUTURE) ×2 IMPLANT
SYR 20ML LL LF (SYRINGE) ×2 IMPLANT
SYR BULB IRRIG 60ML STRL (SYRINGE) ×2 IMPLANT
SYR CONTROL 10ML LL (SYRINGE) ×4 IMPLANT
SYR TB 1ML LUER SLIP (SYRINGE) ×8 IMPLANT
TOWEL GREEN STERILE (TOWEL DISPOSABLE) ×2 IMPLANT
TOWEL GREEN STERILE FF (TOWEL DISPOSABLE) ×2 IMPLANT
WATER STERILE IRR 1000ML POUR (IV SOLUTION) ×2 IMPLANT
YANKAUER SUCT BULB TIP NO VENT (SUCTIONS) ×2 IMPLANT

## 2021-10-12 NOTE — Anesthesia Procedure Notes (Signed)
Procedure Name: Intubation Date/Time: 10/12/2021 11:19 AM  Performed by: Rande Brunt, CRNAPre-anesthesia Checklist: Patient identified, Emergency Drugs available, Suction available and Patient being monitored Patient Re-evaluated:Patient Re-evaluated prior to induction Oxygen Delivery Method: Circle System Utilized Preoxygenation: Pre-oxygenation with 100% oxygen Induction Type: IV induction Ventilation: Mask ventilation without difficulty Laryngoscope Size: Miller and 2 Grade View: Grade I Tube type: Oral Tube size: 7.0 mm Number of attempts: 1 Airway Equipment and Method: Stylet and Oral airway Placement Confirmation: ETT inserted through vocal cords under direct vision, positive ETCO2 and breath sounds checked- equal and bilateral Secured at: 22 cm Tube secured with: Tape Dental Injury: Teeth and Oropharynx as per pre-operative assessment

## 2021-10-12 NOTE — Op Note (Signed)
PATIENT NAME: [name]   MEDICAL RECORD NO.:   [id number]   DATE OF BIRTH: 1970/04/20   DATE OF PROCEDURE: 10/12/2021                                OPERATIVE REPORT     PREOPERATIVE DIAGNOSES: 1. Right-sided L5 radiculopathy. 2. 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   POSTOPERATIVE DIAGNOSES: PREOPERATIVE DIAGNOSES: 1. Right-sided L5 radiculopathy. 2. 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   PROCEDURES:  Right-sided L4/5 laminotomy with partial facetectomy and removal of herniated right -sided L4/5 disk fragment.   SURGEON:  Phylliss Bob, MD.   ASSISTANTPricilla Holm, PA-C.   ANESTHESIA:  General endotracheal anesthesia.   COMPLICATIONS:  None.   DISPOSITION:  Stable.   ESTIMATED BLOOD LOSS:  Minimal.   INDICATIONS FOR SURGERY:  Briefly, Ms. Fendley is a pleasant 51 year old female, who did present to me with severe pain in the right  leg.  The patient's MRI did reveal the findings outlined above.  Extensive work-up was performed in order to ensure that her pain was in fact coming from the herniation noted above.  The work-up did provide some conflicting information.  Specifically, her EMG was negative, and she did not gain any improvement with epidural injections.  Her MRI did not reveal severe nerve compression, however, her pain was very consistent with lumbar radiculopathy.  We therefore did elect to proceed with surgery, given the severity of her pain and her clinical symptoms.  She did understand that surgery would not be any guarantee of resolution of her pain, but we did elect to proceed with a right-sided L4-5 microdiscectomy, as it did appear that her disc herniation, although small, was likely the origin of her symptoms. The patient was fully made aware of the risks of surgery, including the risk of recurrent herniation and the need for subsequent surgery, including  the possibility of a subsequent diskectomy and/or fusion.   OPERATIVE DETAILS:  On 10/12/2021, the patient was brought to surgery and general endotracheal anesthesia was administered.  The patient was placed prone on a well-padded flat Jackson bed with a spinal frame.  Antibiotics were given.  The back was prepped and draped and a time-out procedure was performed.  At this point, a midline incision was made directly over the L4/5 intervertebral space.  A curvilinear incision was made just to the right of the midline into the fascia.  A self-retaining McCulloch retractor was placed.  The lamina of L4 and L5 was identified and subperiosteally exposed.  I then removed the lateral aspect of the L4/5 ligamentum flavum.  Readily identified was the traversing right L5 nerve, which was noted to be under obvious tension, and was noted to be rather erythematous.  I was able to gently gain medial retraction of the nerve, and in doing so, it was apparent that the posterolateral aspect of the annulus of the L4-5 intervertebral disc was protruded posteriorly.  I then used a 15 blade knife to perform an annulotomy.  I then used a series of straight and up angled micro pituitaries to remove multiple free disc fragments immediately ventral to the annulus, working through the annulotomy.  In this fashion, I was able to entirely decompress the traversing right L5 nerve. At this point, the nerve was evaluated, and  was noted to be free and mobile, with no compression noted. I was very pleased with the final decompression that I was able to accomplish.  At this point, the wound was copiously irrigated with normal saline.  All epidural bleeding was controlled using bipolar electrocautery in addition to Surgiflo. All bleeding was controlled at the termination of the procedure.  At this point, 20 mg of Depo-Medrol was introduced about the epidural space in the region of the right L5 nerve.  The wound was then closed  in layers using #1 Vicryl followed by 2-0 Vicryl, followed by 4-0 Monocryl. Benzoin and Steri-Strips were applied followed by a sterile dressing. All instrument counts were correct at the termination of the procedure.   Of note, Pricilla Holm was my assistant throughout surgery, and did aid in retraction, suctioning, and closure from start to finish.       Phylliss Bob, MD

## 2021-10-12 NOTE — H&P (Signed)
PREOPERATIVE H&P  Chief Complaint: Right leg pain  HPI: [name] is a 51 y.o. 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   Past Surgical History:  Procedure Laterality Date   APPENDECTOMY     CESAREAN SECTION     x1   CHOLECYSTECTOMY     FOOT SURGERY     LASIK     NOSE SURGERY     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    TONSILLECTOMY     TYMPANOSTOMY TUBE PLACEMENT     Social History   Socioeconomic History   Marital status: Married    Spouse name: Not on file   Number of children: Not on file   Years of education: Not on file   Highest education level: Not on file  Occupational History   Not on file  Tobacco Use   Smoking status: Former   Smokeless tobacco: Never  Vaping Use   Vaping Use: Never used  Substance and Sexual Activity   Alcohol use: Yes    Comment: social- rare    Drug use: Never   Sexual activity: Not on file  Other Topics Concern   Not on file  Social History Narrative   Not on file   Social Determinants of Health   Financial Resource Strain: Not on file  Food Insecurity: Not on file  Transportation Needs: Not on file  Physical Activity: Not on file  Stress: Not on file  Social Connections: Not on file   Family History  Problem Relation Age of Onset   Kidney cancer Maternal Grandfather    Breast cancer Paternal Grandmother    Kidney cancer Maternal Aunt    Colon polyps Neg Hx    Colon cancer Neg Hx    Esophageal cancer Neg Hx    Rectal cancer Neg Hx    Stomach cancer Neg Hx    Allergies  Allergen Reactions   Gluten Meal     Migraines    Oxycodone Hcl Nausea  And Vomiting   Penicillins Rash   Prior to Admission medications   Medication Sig Start Date End Date Taking? Authorizing Provider  ibuprofen (ADVIL) 800 MG tablet Take 800 mg by mouth every 8 (eight) hours as needed for moderate pain.   Yes [provider]  loratadine (CLARITIN) 10 MG tablet Take 10 mg by mouth daily.   Yes [provider]  Melatonin 5 MG CAPS Take 5 mg by mouth at bedtime.   Yes [provider]  Multiple Vitamin (MULTIVITAMIN) LIQD Take 15 mLs by mouth daily.   Yes [provider]  Multiple Vitamins-Minerals (EMERGEN-C VITAMIN C PO) Take 1 packet by mouth daily.   Yes [provider]  naproxen sodium (ALEVE) 220 MG tablet Take 440 mg by mouth daily as needed (pain).   Yes [provider]  DULoxetine (CYMBALTA) 30 MG capsule TAKE 1 CAPSULE BY MOUTH EVERY DAY 10/04/21   Jennye Boroughs, MD     All other systems have been reviewed and were otherwise negative with the exception of those mentioned in  the HPI and as above.  Physical Exam: There were no vitals filed for this visit.  There is no height or weight on file to calculate BMI.  General: Alert, no acute distress Cardiovascular: No pedal edema Respiratory: No cyanosis, no use of accessory musculature Skin: No lesions in the area of chief complaint Neurologic: Sensation intact distally Psychiatric: Patient is competent for consent with normal mood and affect Lymphatic: No axillary or cervical lymphadenopathy    Assessment/Plan: Right L5 radiculopathy secondary to a right-sided L4-5 disc protrusion, minimally displacing the right L5 nerve Plan for Procedure(s): RIGHT-SIDED LUMBAR 4 - LUMBAR 5 MICRODISECTOMY   Norva Karvonen, MD 10/12/2021 6:24 AM

## 2021-10-12 NOTE — Anesthesia Preprocedure Evaluation (Signed)
Anesthesia Evaluation  Patient identified by MRN, date of birth, ID band Patient awake    Reviewed: Allergy & Precautions, NPO status , Patient's Chart, lab work & pertinent test results  Airway Mallampati: II  TM Distance: >3 FB Neck ROM: Full    Dental no notable dental hx.    Pulmonary neg pulmonary ROS, former smoker,    Pulmonary exam normal breath sounds clear to auscultation       Cardiovascular negative cardio ROS Normal cardiovascular exam Rhythm:Regular Rate:Normal     Neuro/Psych Anxiety negative neurological ROS     GI/Hepatic negative GI ROS, Neg liver ROS,   Endo/Other  negative endocrine ROS  Renal/GU negative Renal ROS  negative genitourinary   Musculoskeletal negative musculoskeletal ROS (+)   Abdominal   Peds negative pediatric ROS (+)  Hematology negative hematology ROS (+)   Anesthesia Other Findings   Reproductive/Obstetrics negative OB ROS                             Anesthesia Physical Anesthesia Plan  ASA: 2  Anesthesia Plan: General   Post-op Pain Management: Ofirmev IV (intra-op)*   Induction: Intravenous  PONV Risk Score and Plan: 3 and Ondansetron, Dexamethasone, Midazolam and Treatment may vary due to age or medical condition  Airway Management Planned: Oral ETT  Additional Equipment:   Intra-op Plan:   Post-operative Plan: Extubation in OR  Informed Consent: I have reviewed the patients History and Physical, chart, labs and discussed the procedure including the risks, benefits and alternatives for the proposed anesthesia with the patient or authorized representative who has indicated his/her understanding and acceptance.     Dental advisory given  Plan Discussed with: CRNA and Surgeon  Anesthesia Plan Comments:         Anesthesia Quick Evaluation

## 2021-10-12 NOTE — Transfer of Care (Signed)
Immediate Anesthesia Transfer of Care Note  Patient: [name]  Procedure(s) Performed: RIGHT-SIDED LUMBAR 4 - LUMBAR 5 MICRODISECTOMY (Right)  Patient Location: PACU  Anesthesia Type:General  Level of Consciousness: awake, alert  and oriented  Airway & Oxygen Therapy: Patient Spontanous Breathing  Post-op Assessment: Report given to RN and Post -op Vital signs reviewed and stable  Post vital signs: Reviewed and stable  Last Vitals:  Vitals Value Taken Time  BP 102/69 10/12/21 1315  Temp 36.6 C 10/12/21 1305  Pulse 81 10/12/21 1315  Resp 15 10/12/21 1315  SpO2 93 % 10/12/21 1315  Vitals shown include unvalidated device data.  Last Pain:  Vitals:   10/12/21 1305  TempSrc:   PainSc: 0-No pain         Complications: No notable events documented.

## 2021-10-13 ENCOUNTER — Encounter (HOSPITAL_COMMUNITY): Payer: Self-pay | Admitting: Orthopedic Surgery

## 2021-10-13 MED FILL — Thrombin (Recombinant) For Soln 20000 Unit: CUTANEOUS | Qty: 1 | Status: AC

## 2021-10-13 NOTE — Anesthesia Postprocedure Evaluation (Signed)
Anesthesia Post Note  Patient: [name]  Procedure(s) Performed: RIGHT-SIDED LUMBAR 4 - LUMBAR 5 MICRODISECTOMY (Right)     Patient location during evaluation: PACU Anesthesia Type: General Level of consciousness: sedated and patient cooperative Pain management: pain level controlled Vital Signs Assessment: post-procedure vital signs reviewed and stable Respiratory status: spontaneous breathing Cardiovascular status: stable Anesthetic complications: no   No notable events documented.  Last Vitals:  Vitals:   10/12/21 1330 10/12/21 1345  BP: 107/72 (!) 116/91  Pulse: 76 77  Resp: 12 14  Temp:  36.7 C  SpO2: 94% 95%    Last Pain:  Vitals:   10/12/21 1330  TempSrc:   PainSc: [place]

## 2021-11-07 ENCOUNTER — Encounter: Payer: BC Managed Care – PPO | Admitting: Physical Medicine & Rehabilitation

## 2021-11-14 ENCOUNTER — Ambulatory Visit: Payer: BC Managed Care – PPO | Admitting: Physical Medicine and Rehabilitation

## 2021-11-16 DIAGNOSIS — D2272 Melanocytic nevi of left lower limb, including hip: Secondary | ICD-10-CM | POA: Diagnosis not present

## 2021-11-16 DIAGNOSIS — L821 Other seborrheic keratosis: Secondary | ICD-10-CM | POA: Diagnosis not present

## 2021-11-16 DIAGNOSIS — L814 Other melanin hyperpigmentation: Secondary | ICD-10-CM | POA: Diagnosis not present

## 2021-11-16 DIAGNOSIS — D2261 Melanocytic nevi of right upper limb, including shoulder: Secondary | ICD-10-CM | POA: Diagnosis not present

## 2021-11-29 DIAGNOSIS — R269 Unspecified abnormalities of gait and mobility: Secondary | ICD-10-CM | POA: Diagnosis not present

## 2021-11-29 DIAGNOSIS — M6281 Muscle weakness (generalized): Secondary | ICD-10-CM | POA: Diagnosis not present

## 2021-11-29 DIAGNOSIS — Z9889 Other specified postprocedural states: Secondary | ICD-10-CM | POA: Diagnosis not present

## 2021-12-12 ENCOUNTER — Encounter (HOSPITAL_BASED_OUTPATIENT_CLINIC_OR_DEPARTMENT_OTHER): Payer: Self-pay | Admitting: Emergency Medicine

## 2021-12-12 ENCOUNTER — Other Ambulatory Visit: Payer: Self-pay

## 2021-12-12 ENCOUNTER — Encounter: Payer: BC Managed Care – PPO | Admitting: Neurology

## 2021-12-12 DIAGNOSIS — Z79899 Other long term (current) drug therapy: Secondary | ICD-10-CM | POA: Diagnosis not present

## 2021-12-12 DIAGNOSIS — M5412 Radiculopathy, cervical region: Secondary | ICD-10-CM | POA: Diagnosis not present

## 2021-12-12 DIAGNOSIS — M25512 Pain in left shoulder: Secondary | ICD-10-CM | POA: Insufficient documentation

## 2021-12-12 NOTE — ED Triage Notes (Signed)
Patient arrived via POV c/o shoulder pain x 3 days. Patient states unable move arm without pain. Patient had similar event approximately 1 year prior. Patient states 10/10 pain. Patient states tramadol and ibuprofen prior to arrival with no relief. Patient is AO x 4, VS WDL, normal gait.

## 2021-12-13 ENCOUNTER — Emergency Department (HOSPITAL_BASED_OUTPATIENT_CLINIC_OR_DEPARTMENT_OTHER)
Admission: EM | Admit: 2021-12-13 | Discharge: 2021-12-13 | Disposition: A | Discharge status: Home / Self Care | Payer: BC Managed Care – PPO | Attending: Emergency Medicine | Admitting: Emergency Medicine

## 2021-12-13 DIAGNOSIS — M67912 Unspecified disorder of synovium and tendon, left shoulder: Secondary | ICD-10-CM | POA: Diagnosis not present

## 2021-12-13 DIAGNOSIS — M25512 Pain in left shoulder: Secondary | ICD-10-CM

## 2021-12-13 DIAGNOSIS — M5412 Radiculopathy, cervical region: Secondary | ICD-10-CM

## 2021-12-13 DIAGNOSIS — M542 Cervicalgia: Secondary | ICD-10-CM | POA: Diagnosis not present

## 2021-12-13 MED ORDER — METHOCARBAMOL 750 MG PO TABS: 750.0000 mg | ORAL_TABLET | Freq: Four times a day (QID) | ORAL | 0 refills | Status: AC | PRN

## 2021-12-13 MED ORDER — PREDNISONE 50 MG PO TABS
60.0000 mg | ORAL_TABLET | Freq: Once | ORAL | Status: AC
  Administered 2021-12-13: 60 mg via ORAL
  Filled 2021-12-13: qty 1

## 2021-12-13 MED ORDER — PREDNISONE 10 MG (21) PO TBPK: ORAL_TABLET | ORAL | 0 refills | Status: AC

## 2021-12-13 MED ORDER — METHOCARBAMOL 500 MG PO TABS
500.0000 mg | ORAL_TABLET | Freq: Once | ORAL | Status: AC
  Administered 2021-12-13: 500 mg via ORAL
  Filled 2021-12-13: qty 1

## 2021-12-13 MED ORDER — KETOROLAC TROMETHAMINE 30 MG/ML IJ SOLN
30.0000 mg | Freq: Once | INTRAMUSCULAR | Status: AC
  Administered 2021-12-13: 30 mg via INTRAMUSCULAR
  Filled 2021-12-13: qty 1

## 2021-12-13 NOTE — ED Provider Notes (Signed)
[facility] EMERGENCY DEPARTMENT  Provider Note  CSN: [id number] Arrival date & time: 12/12/21 2326  History Chief Complaint  Patient presents with   Shoulder Pain    [name] is a 51 y.o. female reports 3 days of L shoulder/neck pain radiating down L arm. Started Sunday night has been getting worse. Associated with some tingling in L hand. No falls or injuries. Has tried Tramdol, Motrin and flexeril she had left over from back surgery in June without improvement. Pain is worse with movement.    Home Medications Prior to Admission medications   Medication Sig Start Date End Date Taking? Authorizing Provider  predniSONE (STERAPRED UNI-PAK 21 TAB) 10 MG (21) TBPK tablet '10mg'$  Tabs, 6 day taper. Use as directed 12/13/21  Yes Truddie Hidden, MD  DULoxetine (CYMBALTA) 30 MG capsule TAKE 1 CAPSULE BY MOUTH EVERY DAY 10/04/21   Jennye Boroughs, MD  HYDROcodone-acetaminophen (NORCO/VICODIN) 5-325 MG tablet Take 1 tablet by mouth every 6 (six) hours as needed for moderate pain or severe pain. 10/12/21 10/12/22  McKenzie, Lennie Muckle, PA-C  loratadine (CLARITIN) 10 MG tablet Take 10 mg by mouth daily.    [provider]  Melatonin 5 MG CAPS Take 5 mg by mouth at bedtime.    [provider]  methocarbamol (ROBAXIN) 750 MG tablet Take 1 tablet (750 mg total) by mouth every 6 (six) hours as needed for muscle spasms. 12/13/21   Truddie Hidden, MD  Multiple Vitamin (MULTIVITAMIN) LIQD Take 15 mLs by mouth daily.    [provider]  Multiple Vitamins-Minerals (EMERGEN-C VITAMIN C PO) Take 1 packet by mouth daily.    [provider]     Allergies    Gluten meal, Oxycodone hcl, and Penicillins   Review of Systems   Review of Systems Please see HPI for pertinent positives and negatives  Physical Exam BP (!) 147/82 (BP Location: Right Arm)   Pulse 96   Temp 98.7 F (37.1 C) (Oral)   Resp 18   Ht '5\' 1"'$  (1.549 m)   Wt 86.2 kg   SpO2  95%   BMI 35.90 kg/m   Physical Exam Vitals and nursing note reviewed.  HENT:     Head: Normocephalic.     Nose: Nose normal.  Eyes:     Extraocular Movements: Extraocular movements intact.  Cardiovascular:     Pulses: Normal pulses.  Pulmonary:     Effort: Pulmonary effort is normal.  Musculoskeletal:        General: Tenderness (muscles of left trapezius and cervical paraspinal areas) present. Normal range of motion.     Cervical back: Neck supple.  Skin:    Findings: No rash (on exposed skin).  Neurological:     Mental Status: She is alert and oriented to person, place, and time.     Sensory: No sensory deficit.     Motor: No weakness.  Psychiatric:        Mood and Affect: Mood normal.     ED Results / Procedures / Treatments   EKG None  Procedures Procedures  Medications Ordered in the ED Medications  ketorolac (TORADOL) 30 MG/ML injection 30 mg (has no administration in time range)  predniSONE (DELTASONE) tablet 60 mg (has no administration in time range)  methocarbamol (ROBAXIN) tablet 500 mg (has no administration in time range)    Initial Impression and Plan  Patient with symptoms consistent with trapezius muscle spasm and cervical radiculopathy. Will treat with NSAIDs, robaxin  and prednisone. She has Ortho follow up scheduled for tomorrow. Sling for comfort. RTED for any other concerns.   ED Course       MDM Rules/Calculators/A&P Medical Decision Making Problems Addressed: Acute pain of left shoulder: acute illness or injury Cervical radiculopathy: acute illness or injury  Risk Prescription drug management.    Final Clinical Impression(s) / ED Diagnoses Final diagnoses:  Acute pain of left shoulder  Cervical radiculopathy    Rx / DC Orders ED Discharge Orders          Ordered    methocarbamol (ROBAXIN) 750 MG tablet  Every 6 hours PRN        12/13/21 0234    predniSONE (STERAPRED UNI-PAK 21 TAB) 10 MG (21) TBPK tablet        12/13/21  0234             Truddie Hidden, MD 12/13/21 [phone]

## 2021-12-19 DIAGNOSIS — R269 Unspecified abnormalities of gait and mobility: Secondary | ICD-10-CM | POA: Diagnosis not present

## 2021-12-19 DIAGNOSIS — Z9889 Other specified postprocedural states: Secondary | ICD-10-CM | POA: Diagnosis not present

## 2021-12-19 DIAGNOSIS — M6281 Muscle weakness (generalized): Secondary | ICD-10-CM | POA: Diagnosis not present

## 2021-12-21 DIAGNOSIS — R269 Unspecified abnormalities of gait and mobility: Secondary | ICD-10-CM | POA: Diagnosis not present

## 2021-12-21 DIAGNOSIS — M6281 Muscle weakness (generalized): Secondary | ICD-10-CM | POA: Diagnosis not present

## 2021-12-21 DIAGNOSIS — Z9889 Other specified postprocedural states: Secondary | ICD-10-CM | POA: Diagnosis not present

## 2021-12-25 DIAGNOSIS — Z9889 Other specified postprocedural states: Secondary | ICD-10-CM | POA: Diagnosis not present

## 2021-12-25 DIAGNOSIS — M6281 Muscle weakness (generalized): Secondary | ICD-10-CM | POA: Diagnosis not present

## 2021-12-25 DIAGNOSIS — R269 Unspecified abnormalities of gait and mobility: Secondary | ICD-10-CM | POA: Diagnosis not present

## 2021-12-26 DIAGNOSIS — N390 Urinary tract infection, site not specified: Secondary | ICD-10-CM | POA: Diagnosis not present

## 2022-02-21 DIAGNOSIS — Z13220 Encounter for screening for lipoid disorders: Secondary | ICD-10-CM | POA: Diagnosis not present

## 2022-02-21 DIAGNOSIS — Z0000 Encounter for general adult medical examination without abnormal findings: Secondary | ICD-10-CM | POA: Diagnosis not present

## 2022-02-21 DIAGNOSIS — N951 Menopausal and female climacteric states: Secondary | ICD-10-CM | POA: Diagnosis not present

## 2022-02-21 DIAGNOSIS — Z79899 Other long term (current) drug therapy: Secondary | ICD-10-CM | POA: Diagnosis not present

## 2022-02-21 DIAGNOSIS — G43909 Migraine, unspecified, not intractable, without status migrainosus: Secondary | ICD-10-CM | POA: Diagnosis not present

## 2022-02-21 LAB — COMPREHENSIVE METABOLIC PANEL (CC13): EGFR: 105

## 2022-03-23 DIAGNOSIS — R945 Abnormal results of liver function studies: Secondary | ICD-10-CM | POA: Diagnosis not present

## 2022-05-03 DIAGNOSIS — M546 Pain in thoracic spine: Secondary | ICD-10-CM | POA: Diagnosis not present

## 2022-06-11 DIAGNOSIS — Z1231 Encounter for screening mammogram for malignant neoplasm of breast: Secondary | ICD-10-CM | POA: Diagnosis not present

## 2022-06-11 DIAGNOSIS — Z01419 Encounter for gynecological examination (general) (routine) without abnormal findings: Secondary | ICD-10-CM | POA: Diagnosis not present

## 2022-06-11 DIAGNOSIS — Z6836 Body mass index (BMI) 36.0-36.9, adult: Secondary | ICD-10-CM | POA: Diagnosis not present

## 2022-06-27 DIAGNOSIS — R0982 Postnasal drip: Secondary | ICD-10-CM | POA: Diagnosis not present

## 2022-06-27 DIAGNOSIS — J0100 Acute maxillary sinusitis, unspecified: Secondary | ICD-10-CM | POA: Diagnosis not present

## 2022-09-26 DIAGNOSIS — M67911 Unspecified disorder of synovium and tendon, right shoulder: Secondary | ICD-10-CM | POA: Diagnosis not present

## 2022-10-08 DIAGNOSIS — N3001 Acute cystitis with hematuria: Secondary | ICD-10-CM | POA: Diagnosis not present

## 2022-11-19 DIAGNOSIS — L111 Transient acantholytic dermatosis [Grover]: Secondary | ICD-10-CM | POA: Diagnosis not present

## 2022-11-19 DIAGNOSIS — D225 Melanocytic nevi of trunk: Secondary | ICD-10-CM | POA: Diagnosis not present

## 2022-11-19 DIAGNOSIS — L814 Other melanin hyperpigmentation: Secondary | ICD-10-CM | POA: Diagnosis not present

## 2022-11-19 DIAGNOSIS — L821 Other seborrheic keratosis: Secondary | ICD-10-CM | POA: Diagnosis not present

## 2022-12-10 DIAGNOSIS — J069 Acute upper respiratory infection, unspecified: Secondary | ICD-10-CM | POA: Diagnosis not present

## 2022-12-18 DIAGNOSIS — H5203 Hypermetropia, bilateral: Secondary | ICD-10-CM | POA: Diagnosis not present

## 2022-12-18 DIAGNOSIS — H33303 Unspecified retinal break, bilateral: Secondary | ICD-10-CM | POA: Diagnosis not present

## 2022-12-24 DIAGNOSIS — M67911 Unspecified disorder of synovium and tendon, right shoulder: Secondary | ICD-10-CM | POA: Diagnosis not present

## 2023-01-03 DIAGNOSIS — M25511 Pain in right shoulder: Secondary | ICD-10-CM | POA: Diagnosis not present

## 2023-01-21 DIAGNOSIS — M67911 Unspecified disorder of synovium and tendon, right shoulder: Secondary | ICD-10-CM | POA: Diagnosis not present

## 2023-01-28 DIAGNOSIS — S46011D Strain of muscle(s) and tendon(s) of the rotator cuff of right shoulder, subsequent encounter: Secondary | ICD-10-CM | POA: Diagnosis not present

## 2023-01-30 DIAGNOSIS — S46011D Strain of muscle(s) and tendon(s) of the rotator cuff of right shoulder, subsequent encounter: Secondary | ICD-10-CM | POA: Diagnosis not present

## 2023-02-04 DIAGNOSIS — S46011D Strain of muscle(s) and tendon(s) of the rotator cuff of right shoulder, subsequent encounter: Secondary | ICD-10-CM | POA: Diagnosis not present

## 2023-02-05 DIAGNOSIS — S46011D Strain of muscle(s) and tendon(s) of the rotator cuff of right shoulder, subsequent encounter: Secondary | ICD-10-CM | POA: Diagnosis not present

## 2023-02-11 DIAGNOSIS — S46011D Strain of muscle(s) and tendon(s) of the rotator cuff of right shoulder, subsequent encounter: Secondary | ICD-10-CM | POA: Diagnosis not present

## 2023-02-13 DIAGNOSIS — S46011D Strain of muscle(s) and tendon(s) of the rotator cuff of right shoulder, subsequent encounter: Secondary | ICD-10-CM | POA: Diagnosis not present

## 2023-02-28 DIAGNOSIS — Z0000 Encounter for general adult medical examination without abnormal findings: Secondary | ICD-10-CM | POA: Diagnosis not present

## 2023-02-28 DIAGNOSIS — N951 Menopausal and female climacteric states: Secondary | ICD-10-CM | POA: Diagnosis not present

## 2023-02-28 DIAGNOSIS — Z79899 Other long term (current) drug therapy: Secondary | ICD-10-CM | POA: Diagnosis not present

## 2023-02-28 DIAGNOSIS — Z13220 Encounter for screening for lipoid disorders: Secondary | ICD-10-CM | POA: Diagnosis not present

## 2023-02-28 LAB — COMPREHENSIVE METABOLIC PANEL (CC13): EGFR: 102

## 2023-03-04 DIAGNOSIS — M67911 Unspecified disorder of synovium and tendon, right shoulder: Secondary | ICD-10-CM | POA: Diagnosis not present

## 2023-03-07 DIAGNOSIS — M25531 Pain in right wrist: Secondary | ICD-10-CM | POA: Diagnosis not present

## 2023-03-11 ENCOUNTER — Encounter: Payer: Self-pay | Admitting: Gastroenterology

## 2023-03-18 DIAGNOSIS — R0683 Snoring: Secondary | ICD-10-CM | POA: Diagnosis not present

## 2023-04-02 DIAGNOSIS — M25531 Pain in right wrist: Secondary | ICD-10-CM | POA: Diagnosis not present

## 2023-04-04 DIAGNOSIS — M7581 Other shoulder lesions, right shoulder: Secondary | ICD-10-CM | POA: Diagnosis not present

## 2023-04-04 DIAGNOSIS — M75111 Incomplete rotator cuff tear or rupture of right shoulder, not specified as traumatic: Secondary | ICD-10-CM | POA: Diagnosis not present

## 2023-04-04 DIAGNOSIS — M7541 Impingement syndrome of right shoulder: Secondary | ICD-10-CM | POA: Diagnosis not present

## 2023-04-04 DIAGNOSIS — G8918 Other acute postprocedural pain: Secondary | ICD-10-CM | POA: Diagnosis not present

## 2023-04-04 DIAGNOSIS — M75121 Complete rotator cuff tear or rupture of right shoulder, not specified as traumatic: Secondary | ICD-10-CM | POA: Diagnosis not present

## 2023-04-16 DIAGNOSIS — M25511 Pain in right shoulder: Secondary | ICD-10-CM | POA: Diagnosis not present

## 2023-04-16 DIAGNOSIS — R531 Weakness: Secondary | ICD-10-CM | POA: Diagnosis not present

## 2023-04-16 DIAGNOSIS — M75121 Complete rotator cuff tear or rupture of right shoulder, not specified as traumatic: Secondary | ICD-10-CM | POA: Diagnosis not present

## 2023-04-18 DIAGNOSIS — M25511 Pain in right shoulder: Secondary | ICD-10-CM | POA: Diagnosis not present

## 2023-04-18 DIAGNOSIS — M75121 Complete rotator cuff tear or rupture of right shoulder, not specified as traumatic: Secondary | ICD-10-CM | POA: Diagnosis not present

## 2023-04-18 DIAGNOSIS — R531 Weakness: Secondary | ICD-10-CM | POA: Diagnosis not present

## 2023-04-22 DIAGNOSIS — G4733 Obstructive sleep apnea (adult) (pediatric): Secondary | ICD-10-CM | POA: Diagnosis not present

## 2023-04-24 DIAGNOSIS — R531 Weakness: Secondary | ICD-10-CM | POA: Diagnosis not present

## 2023-04-24 DIAGNOSIS — M75121 Complete rotator cuff tear or rupture of right shoulder, not specified as traumatic: Secondary | ICD-10-CM | POA: Diagnosis not present

## 2023-04-24 DIAGNOSIS — M25511 Pain in right shoulder: Secondary | ICD-10-CM | POA: Diagnosis not present

## 2023-04-25 DIAGNOSIS — M25511 Pain in right shoulder: Secondary | ICD-10-CM | POA: Diagnosis not present

## 2023-04-25 DIAGNOSIS — R531 Weakness: Secondary | ICD-10-CM | POA: Diagnosis not present

## 2023-04-25 DIAGNOSIS — M75121 Complete rotator cuff tear or rupture of right shoulder, not specified as traumatic: Secondary | ICD-10-CM | POA: Diagnosis not present

## 2023-04-30 DIAGNOSIS — R531 Weakness: Secondary | ICD-10-CM | POA: Diagnosis not present

## 2023-04-30 DIAGNOSIS — M25511 Pain in right shoulder: Secondary | ICD-10-CM | POA: Diagnosis not present

## 2023-04-30 DIAGNOSIS — M75121 Complete rotator cuff tear or rupture of right shoulder, not specified as traumatic: Secondary | ICD-10-CM | POA: Diagnosis not present

## 2023-05-02 DIAGNOSIS — M25511 Pain in right shoulder: Secondary | ICD-10-CM | POA: Diagnosis not present

## 2023-05-02 DIAGNOSIS — M75121 Complete rotator cuff tear or rupture of right shoulder, not specified as traumatic: Secondary | ICD-10-CM | POA: Diagnosis not present

## 2023-05-02 DIAGNOSIS — R531 Weakness: Secondary | ICD-10-CM | POA: Diagnosis not present

## 2023-05-06 DIAGNOSIS — M25511 Pain in right shoulder: Secondary | ICD-10-CM | POA: Diagnosis not present

## 2023-05-06 DIAGNOSIS — R531 Weakness: Secondary | ICD-10-CM | POA: Diagnosis not present

## 2023-05-06 DIAGNOSIS — M75121 Complete rotator cuff tear or rupture of right shoulder, not specified as traumatic: Secondary | ICD-10-CM | POA: Diagnosis not present

## 2023-05-08 DIAGNOSIS — M75121 Complete rotator cuff tear or rupture of right shoulder, not specified as traumatic: Secondary | ICD-10-CM | POA: Diagnosis not present

## 2023-05-08 DIAGNOSIS — R531 Weakness: Secondary | ICD-10-CM | POA: Diagnosis not present

## 2023-05-08 DIAGNOSIS — M25511 Pain in right shoulder: Secondary | ICD-10-CM | POA: Diagnosis not present

## 2023-05-13 DIAGNOSIS — M25511 Pain in right shoulder: Secondary | ICD-10-CM | POA: Diagnosis not present

## 2023-05-13 DIAGNOSIS — R531 Weakness: Secondary | ICD-10-CM | POA: Diagnosis not present

## 2023-05-13 DIAGNOSIS — M75121 Complete rotator cuff tear or rupture of right shoulder, not specified as traumatic: Secondary | ICD-10-CM | POA: Diagnosis not present

## 2023-05-15 DIAGNOSIS — R531 Weakness: Secondary | ICD-10-CM | POA: Diagnosis not present

## 2023-05-15 DIAGNOSIS — M25511 Pain in right shoulder: Secondary | ICD-10-CM | POA: Diagnosis not present

## 2023-05-15 DIAGNOSIS — M75121 Complete rotator cuff tear or rupture of right shoulder, not specified as traumatic: Secondary | ICD-10-CM | POA: Diagnosis not present

## 2023-05-20 DIAGNOSIS — M25511 Pain in right shoulder: Secondary | ICD-10-CM | POA: Diagnosis not present

## 2023-05-20 DIAGNOSIS — R531 Weakness: Secondary | ICD-10-CM | POA: Diagnosis not present

## 2023-05-20 DIAGNOSIS — M75121 Complete rotator cuff tear or rupture of right shoulder, not specified as traumatic: Secondary | ICD-10-CM | POA: Diagnosis not present

## 2023-05-22 DIAGNOSIS — J101 Influenza due to other identified influenza virus with other respiratory manifestations: Secondary | ICD-10-CM | POA: Diagnosis not present

## 2023-05-27 DIAGNOSIS — M25511 Pain in right shoulder: Secondary | ICD-10-CM | POA: Diagnosis not present

## 2023-05-27 DIAGNOSIS — R531 Weakness: Secondary | ICD-10-CM | POA: Diagnosis not present

## 2023-05-27 DIAGNOSIS — M75121 Complete rotator cuff tear or rupture of right shoulder, not specified as traumatic: Secondary | ICD-10-CM | POA: Diagnosis not present

## 2023-05-29 DIAGNOSIS — M25511 Pain in right shoulder: Secondary | ICD-10-CM | POA: Diagnosis not present

## 2023-05-29 DIAGNOSIS — R531 Weakness: Secondary | ICD-10-CM | POA: Diagnosis not present

## 2023-05-29 DIAGNOSIS — M75121 Complete rotator cuff tear or rupture of right shoulder, not specified as traumatic: Secondary | ICD-10-CM | POA: Diagnosis not present

## 2023-06-03 DIAGNOSIS — M75121 Complete rotator cuff tear or rupture of right shoulder, not specified as traumatic: Secondary | ICD-10-CM | POA: Diagnosis not present

## 2023-06-03 DIAGNOSIS — R531 Weakness: Secondary | ICD-10-CM | POA: Diagnosis not present

## 2023-06-03 DIAGNOSIS — M25511 Pain in right shoulder: Secondary | ICD-10-CM | POA: Diagnosis not present

## 2023-06-10 DIAGNOSIS — R531 Weakness: Secondary | ICD-10-CM | POA: Diagnosis not present

## 2023-06-10 DIAGNOSIS — M75121 Complete rotator cuff tear or rupture of right shoulder, not specified as traumatic: Secondary | ICD-10-CM | POA: Diagnosis not present

## 2023-06-10 DIAGNOSIS — M25511 Pain in right shoulder: Secondary | ICD-10-CM | POA: Diagnosis not present

## 2023-06-12 DIAGNOSIS — M25511 Pain in right shoulder: Secondary | ICD-10-CM | POA: Diagnosis not present

## 2023-06-12 DIAGNOSIS — R531 Weakness: Secondary | ICD-10-CM | POA: Diagnosis not present

## 2023-06-12 DIAGNOSIS — M75121 Complete rotator cuff tear or rupture of right shoulder, not specified as traumatic: Secondary | ICD-10-CM | POA: Diagnosis not present

## 2023-06-13 DIAGNOSIS — Z01419 Encounter for gynecological examination (general) (routine) without abnormal findings: Secondary | ICD-10-CM | POA: Diagnosis not present

## 2023-06-13 DIAGNOSIS — Z1331 Encounter for screening for depression: Secondary | ICD-10-CM | POA: Diagnosis not present

## 2023-06-13 DIAGNOSIS — Z1231 Encounter for screening mammogram for malignant neoplasm of breast: Secondary | ICD-10-CM | POA: Diagnosis not present

## 2023-06-17 DIAGNOSIS — M25511 Pain in right shoulder: Secondary | ICD-10-CM | POA: Diagnosis not present

## 2023-06-17 DIAGNOSIS — R531 Weakness: Secondary | ICD-10-CM | POA: Diagnosis not present

## 2023-06-17 DIAGNOSIS — M75121 Complete rotator cuff tear or rupture of right shoulder, not specified as traumatic: Secondary | ICD-10-CM | POA: Diagnosis not present

## 2023-06-19 DIAGNOSIS — R531 Weakness: Secondary | ICD-10-CM | POA: Diagnosis not present

## 2023-06-19 DIAGNOSIS — M25511 Pain in right shoulder: Secondary | ICD-10-CM | POA: Diagnosis not present

## 2023-06-19 DIAGNOSIS — M75121 Complete rotator cuff tear or rupture of right shoulder, not specified as traumatic: Secondary | ICD-10-CM | POA: Diagnosis not present

## 2023-06-24 DIAGNOSIS — M75121 Complete rotator cuff tear or rupture of right shoulder, not specified as traumatic: Secondary | ICD-10-CM | POA: Diagnosis not present

## 2023-06-24 DIAGNOSIS — M25511 Pain in right shoulder: Secondary | ICD-10-CM | POA: Diagnosis not present

## 2023-06-24 DIAGNOSIS — R531 Weakness: Secondary | ICD-10-CM | POA: Diagnosis not present

## 2023-06-26 DIAGNOSIS — M25511 Pain in right shoulder: Secondary | ICD-10-CM | POA: Diagnosis not present

## 2023-06-26 DIAGNOSIS — M75121 Complete rotator cuff tear or rupture of right shoulder, not specified as traumatic: Secondary | ICD-10-CM | POA: Diagnosis not present

## 2023-06-26 DIAGNOSIS — R531 Weakness: Secondary | ICD-10-CM | POA: Diagnosis not present

## 2023-07-08 DIAGNOSIS — R531 Weakness: Secondary | ICD-10-CM | POA: Diagnosis not present

## 2023-07-08 DIAGNOSIS — M25511 Pain in right shoulder: Secondary | ICD-10-CM | POA: Diagnosis not present

## 2023-07-08 DIAGNOSIS — M75121 Complete rotator cuff tear or rupture of right shoulder, not specified as traumatic: Secondary | ICD-10-CM | POA: Diagnosis not present

## 2023-07-10 DIAGNOSIS — R531 Weakness: Secondary | ICD-10-CM | POA: Diagnosis not present

## 2023-07-10 DIAGNOSIS — M75121 Complete rotator cuff tear or rupture of right shoulder, not specified as traumatic: Secondary | ICD-10-CM | POA: Diagnosis not present

## 2023-07-10 DIAGNOSIS — M25511 Pain in right shoulder: Secondary | ICD-10-CM | POA: Diagnosis not present

## 2023-07-15 DIAGNOSIS — M75121 Complete rotator cuff tear or rupture of right shoulder, not specified as traumatic: Secondary | ICD-10-CM | POA: Diagnosis not present

## 2023-07-15 DIAGNOSIS — M25511 Pain in right shoulder: Secondary | ICD-10-CM | POA: Diagnosis not present

## 2023-07-15 DIAGNOSIS — R531 Weakness: Secondary | ICD-10-CM | POA: Diagnosis not present

## 2023-07-17 DIAGNOSIS — M25511 Pain in right shoulder: Secondary | ICD-10-CM | POA: Diagnosis not present

## 2023-07-17 DIAGNOSIS — M75121 Complete rotator cuff tear or rupture of right shoulder, not specified as traumatic: Secondary | ICD-10-CM | POA: Diagnosis not present

## 2023-07-17 DIAGNOSIS — R531 Weakness: Secondary | ICD-10-CM | POA: Diagnosis not present

## 2023-08-12 DIAGNOSIS — M25511 Pain in right shoulder: Secondary | ICD-10-CM | POA: Diagnosis not present

## 2023-09-14 DIAGNOSIS — H109 Unspecified conjunctivitis: Secondary | ICD-10-CM | POA: Diagnosis not present

## 2023-09-17 DIAGNOSIS — T1501XA Foreign body in cornea, right eye, initial encounter: Secondary | ICD-10-CM | POA: Diagnosis not present

## 2023-11-21 DIAGNOSIS — D225 Melanocytic nevi of trunk: Secondary | ICD-10-CM | POA: Diagnosis not present

## 2023-11-21 DIAGNOSIS — L814 Other melanin hyperpigmentation: Secondary | ICD-10-CM | POA: Diagnosis not present

## 2023-11-21 DIAGNOSIS — L821 Other seborrheic keratosis: Secondary | ICD-10-CM | POA: Diagnosis not present

## 2023-11-21 DIAGNOSIS — D485 Neoplasm of uncertain behavior of skin: Secondary | ICD-10-CM | POA: Diagnosis not present

## 2023-11-21 DIAGNOSIS — Z86018 Personal history of other benign neoplasm: Secondary | ICD-10-CM | POA: Diagnosis not present

## 2023-11-21 DIAGNOSIS — D235 Other benign neoplasm of skin of trunk: Secondary | ICD-10-CM | POA: Diagnosis not present

## 2023-12-17 DIAGNOSIS — M65312 Trigger thumb, left thumb: Secondary | ICD-10-CM | POA: Diagnosis not present

## 2023-12-29 DIAGNOSIS — N3000 Acute cystitis without hematuria: Secondary | ICD-10-CM | POA: Diagnosis not present

## 2024-01-01 DIAGNOSIS — H5203 Hypermetropia, bilateral: Secondary | ICD-10-CM | POA: Diagnosis not present

## 2024-01-01 DIAGNOSIS — H33103 Unspecified retinoschisis, bilateral: Secondary | ICD-10-CM | POA: Diagnosis not present

## 2024-01-23 DIAGNOSIS — M65312 Trigger thumb, left thumb: Secondary | ICD-10-CM | POA: Diagnosis not present

## 2024-03-03 DIAGNOSIS — Z13220 Encounter for screening for lipoid disorders: Secondary | ICD-10-CM | POA: Diagnosis not present

## 2024-03-03 DIAGNOSIS — Z131 Encounter for screening for diabetes mellitus: Secondary | ICD-10-CM | POA: Diagnosis not present

## 2024-03-03 DIAGNOSIS — G4733 Obstructive sleep apnea (adult) (pediatric): Secondary | ICD-10-CM | POA: Diagnosis not present

## 2024-03-03 DIAGNOSIS — Z79899 Other long term (current) drug therapy: Secondary | ICD-10-CM | POA: Diagnosis not present

## 2024-03-03 DIAGNOSIS — G43909 Migraine, unspecified, not intractable, without status migrainosus: Secondary | ICD-10-CM | POA: Diagnosis not present

## 2024-03-03 DIAGNOSIS — E669 Obesity, unspecified: Secondary | ICD-10-CM | POA: Diagnosis not present

## 2024-03-03 DIAGNOSIS — N951 Menopausal and female climacteric states: Secondary | ICD-10-CM | POA: Diagnosis not present

## 2024-03-03 DIAGNOSIS — Z0000 Encounter for general adult medical examination without abnormal findings: Secondary | ICD-10-CM | POA: Diagnosis not present

## 2024-03-03 LAB — HEMOGLOBIN A1C: A1c: 6.6

## 2024-03-03 LAB — COMPREHENSIVE METABOLIC PANEL (CC13): EGFR: 103

## 2024-03-09 NOTE — Progress Notes (Signed)
 I connected with  [name] on 03/20/24 by a video enabled telemedicine application and verified that I am speaking with the correct person using two identifiers.   I discussed the limitations of evaluation and management by telemedicine. The patient expressed understanding and agreed to proceed.   Diabetes Self-Management Education  Visit Type: First/Initial  Appt. Start Time: 1126 Appt. End Time: 1226  03/20/2024  [name], identified by name and date of birth, is a 53 y.o. female with a diagnosis of Diabetes: Type 2.   ASSESSMENT Patient is here today alone by video. Patient would like to learn about managing diabetes with lifestyle. Pt reports she works from home full time. Pt reports she has access to gym equipment at home and enjoys walking. Patient lives with husband and reports shared shopping and cooking. Pt reports eating out 2-3 times weekly. Pt reports making the following changes including omitting sweets, limiting carbohydrates, decreasing portions and increasing protein. Pt reports she follows a gluten free meal pattern and feels this is going well. Pt reports recently fasting ranging 101- 148  mg/dL and states after meals by 2-3 hours ranging 103-112 mg/dL. All Pt's questions were answered during this encounter.    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    Medications include:   Current Outpatient Medications:    DULoxetine  (CYMBALTA ) 30 MG capsule, TAKE 1 CAPSULE BY MOUTH EVERY DAY, Disp: 90 capsule, Rfl: 0   loratadine (CLARITIN) 10 MG tablet, Take 10 mg by mouth daily., Disp: , Rfl:    Melatonin 5 MG CAPS, Take 5 mg by mouth at bedtime., Disp: , Rfl:    methocarbamol  (ROBAXIN ) 750 MG tablet, Take 1 tablet (750 mg total) by mouth every 6 (six) hours as needed for muscle spasms., Disp: 60 tablet, Rfl: 0   Multiple Vitamin (MULTIVITAMIN) LIQD, Take 15  mLs by mouth daily., Disp: , Rfl:    Multiple Vitamins-Minerals (EMERGEN-C VITAMIN C PO), Take 1 packet by mouth daily., Disp: , Rfl:    predniSONE  (STERAPRED UNI-PAK 21 TAB) 10 MG (21) TBPK tablet, 10mg  Tabs, 6 day taper. Use as directed, Disp: 1 each, Rfl: 0  Current Facility-Administered Medications:    lidocaine  (LIDODERM ) 5 % 1 patch, 1 patch, Transdermal, Q24H, Urbano Albright, MD   Labs noted:   Total cholesterol: 194, TRIG: 107, HDL: 66, LDL: 108 obtained 03/03/2024 per referring provider A1C: 6.6% obtained 03/03/2024 per referring provider   Diabetes Self-Management Education - 03/20/24 1142       Visit Information   Visit Type First/Initial      Initial Visit   Diabetes Type Type 2    Date Diagnosed 2025    Are you currently following a meal plan? Yes   gluten free   Are you taking your medications as prescribed? Yes      Health Coping   How would you rate your overall health? Good      Psychosocial Assessment   Patient Belief/Attitude about Diabetes Motivated to manage diabetes    What is the hardest part about your diabetes right now, causing you the most concern, or is the most worrisome to you about your diabetes?   Making healty food and beverage choices;Checking blood sugar;Being active;Getting support / problem solving    Self-care barriers None    Self-management support Doctor's office    Other  persons present Patient    Patient Concerns Nutrition/Meal planning;Monitoring;Healthy Lifestyle;Problem Solving;Glycemic Control    Special Needs None    Preferred Learning Style No preference indicated    Learning Readiness Change in progress    How often do you need to have someone help you when you read instructions, pamphlets, or other written materials from your doctor or pharmacy? 1 - Never    What is the last grade level you completed in school? some college      Pre-Education Assessment   Patient understands the diabetes disease and treatment process. Needs  Instruction    Patient understands incorporating nutritional management into lifestyle. Needs Instruction    Patient undertands incorporating physical activity into lifestyle. Needs Instruction    Patient understands using medications safely. Needs Instruction    Patient understands monitoring blood glucose, interpreting and using results Needs Instruction    Patient understands prevention, detection, and treatment of acute complications. Needs Instruction    Patient understands prevention, detection, and treatment of chronic complications. Needs Instruction    Patient understands how to develop strategies to address psychosocial issues. Needs Instruction    Patient understands how to develop strategies to promote health/change behavior. Needs Instruction      Complications   Last HgB A1C per patient/outside source 6.6 %    How often do you check your blood sugar? 1-2 times/day    Fasting Blood glucose range (mg/dL) 29-870;869-820    Postprandial Blood glucose range (mg/dL) 29-870    Number of hypoglycemic episodes per month 0    Number of hyperglycemic episodes ( >200mg /dL): Never    Have you had a dilated eye exam in the past 12 months? Yes    Have you had a dental exam in the past 12 months? Yes    Are you checking your feet? No   instructed     Dietary Intake   Breakfast 2 egg with cheese, coffee with SF syrup, SF creamer    Lunch leftovers or soup with green beans, peas, carrots, beef, tomotoes, sausage balls, water    Dinner chicken, steak and shrimp, bell peppers, onions, lettuce, sour cream, guacamole, water    Snack (evening) cashews    Beverage(s) coffee with SF syrup, SF creamer, splash, flavored sparkling water      Activity / Exercise   Activity / Exercise Type ADL's      Patient Education   Previous Diabetes Education No    Disease Pathophysiology Explored patient's options for treatment of their diabetes    Healthy Eating Role of diet in the treatment of diabetes and  the relationship between the three main macronutrients and blood glucose level;Plate Method;Reviewed blood glucose goals for pre and post meals and how to evaluate the patients' food intake on their blood glucose level.    Being Active Role of exercise on diabetes management, blood pressure control and cardiac health.    Medications Reviewed patients medication for diabetes, action, purpose, timing of dose and side effects.;Other (comment)   no DM medications at this time   Monitoring Identified appropriate SMBG and/or A1C goals.;Taught/evaluated SMBG meter.;Daily foot exams    Acute complications Discussed and identified patients' prevention, symptoms, and treatment of hyperglycemia.    Chronic complications Relationship between chronic complications and blood glucose control    Diabetes Stress and Support Identified and addressed patients feelings and concerns about diabetes    Preconception care --   n/a   Lifestyle and Health Coping Lifestyle issues that need to be addressed  for better diabetes care      Individualized Goals (developed by patient)   Nutrition Follow meal plan discussed    Physical Activity Exercise 1-2 times per week;30 minutes per day    Medications take my medication as prescribed    Monitoring  Test my blood glucose as discussed    Problem Solving Eating Pattern;Addressing barriers to behavior change    Reducing Risk examine blood glucose patterns;do foot checks daily    Health Coping Ask for help with psychological, social, or emotional issues      Post-Education Assessment   Patient understands the diabetes disease and treatment process. Needs Instruction    Patient understands incorporating nutritional management into lifestyle. Needs Instruction    Patient undertands incorporating physical activity into lifestyle. Needs Instruction    Patient understands using medications safely. Needs Instruction    Patient understands monitoring blood glucose, interpreting and  using results Needs Instruction    Patient understands prevention, detection, and treatment of acute complications. Needs Instruction    Patient understands prevention, detection, and treatment of chronic complications. Needs Instruction    Patient understands how to develop strategies to address psychosocial issues. Needs Instruction    Patient understands how to develop strategies to promote health/change behavior. Needs Instruction      Outcomes   Expected Outcomes Demonstrated interest in learning. Expect positive outcomes    Future DMSE 3-4 months    Program Status Not Completed          Individualized Plan for Diabetes Self-Management Training:   Learning Objective:  Patient will have a greater understanding of diabetes self-management. Patient education plan is to attend individual and/or group sessions per assessed needs and concerns.   Plan:   Patient Instructions  Aim to initiate physical activity on 2-3 minutes for 30 minutes as tolerated    Expected Outcomes:  Demonstrated interest in learning. Expect positive outcomes  Education material provided: ADA - How to Thrive: A Guide for Your Journey with Diabetes, Food label handouts, My Plate, and Snack sheet  If problems or questions, patient to contact team via:  Phone  Future DSME appointment: 3-4 months

## 2024-03-20 ENCOUNTER — Encounter: Admitting: Dietician

## 2024-03-20 DIAGNOSIS — E1169 Type 2 diabetes mellitus with other specified complication: Secondary | ICD-10-CM | POA: Diagnosis not present

## 2024-03-20 NOTE — Patient Instructions (Addendum)
 Aim to initiate physical activity on 2-3 minutes for 30 minutes as tolerated

## 2024-05-18 ENCOUNTER — Encounter: Payer: Self-pay | Admitting: Gastroenterology

## 2024-05-21 ENCOUNTER — Encounter: Payer: Self-pay | Admitting: Gastroenterology

## 2024-05-22 ENCOUNTER — Encounter: Admitting: Dietician

## 2024-05-22 DIAGNOSIS — E1169 Type 2 diabetes mellitus with other specified complication: Secondary | ICD-10-CM

## 2024-06-05 ENCOUNTER — Encounter

## 2024-06-19 ENCOUNTER — Encounter: Admitting: Gastroenterology
